# Patient Record
Sex: Male | Born: 1995 | Race: White | Hispanic: No | Marital: Single | Smoking: Never smoker
Health system: Southern US, Community
[De-identification: ages and names within clinical notes are randomized; demographics above are authoritative.]

---
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
## 2018-01-05 ENCOUNTER — Ambulatory Visit (HOSPITAL_COMMUNITY)
Admission: EM | Admit: 2018-01-05 | Discharge: 2018-01-05 | Disposition: A | Discharge status: Home / Self Care | Payer: Medicaid Other | Attending: Internal Medicine | Admitting: Internal Medicine

## 2018-01-05 ENCOUNTER — Encounter (HOSPITAL_COMMUNITY): Payer: Self-pay

## 2018-01-05 DIAGNOSIS — S161XXA Strain of muscle, fascia and tendon at neck level, initial encounter: Secondary | ICD-10-CM | POA: Diagnosis not present

## 2018-01-05 MED ORDER — CYCLOBENZAPRINE HCL 5 MG PO TABS: 5.0000 mg | ORAL_TABLET | Freq: Every day | ORAL | 0 refills | Status: DC

## 2018-01-05 MED ORDER — MELOXICAM 15 MG PO TABS: 15.0000 mg | ORAL_TABLET | Freq: Every day | ORAL | 0 refills | Status: DC

## 2018-01-05 NOTE — Discharge Instructions (Signed)
See exercises provided.  Use of supportive pillow.  Ice application at end of day.  Flexeril at night as muscle relaxer. May cause drowsiness. Please do not take if driving or drinking alcohol.   Meloxicam daily, take with food. Do not take additional ibuprofen or aleve if taking this.  Please establish with a primary care provider for repeat evaluation if symptoms persist.  Return sooner if develop numbness, tingling or weakness to the arms or hands.

## 2018-01-05 NOTE — ED Triage Notes (Signed)
Pt presents with neck pain on left and right side with movement that has been ongoing for the past 6 months

## 2018-01-05 NOTE — ED Provider Notes (Signed)
[facility]    CSN: [id number] Arrival date & time: 01/05/18  1601     History   Chief Complaint Chief Complaint  Patient presents with  . Neck Pain    HPI TEHRAN RABENOLD is a 22 y.o. male.   Lebron presents with parents with complaints of neck pain. Has been ongoing for the past two months at least. Pain with turning neck primarily. Pain at night. Sharp pain. To bilateral sides of neck. No injury. Does worsen at work. 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 No numbness, tingling or weakness to arms or hands. Has been taking ibuprofen 800mg  every 6-8 hours. Does help some. Does not have a PCP. No headache. No back pain. Without contributing medical history.     ROS per HPI.      History reviewed. No pertinent past medical history.  There are no active problems to display for this patient.   History reviewed. No pertinent surgical history.     Home Medications    Prior to Admission medications   Medication Sig Start Date End Date Taking? Authorizing Provider  cyclobenzaprine (FLEXERIL) 5 MG tablet Take 1 tablet (5 mg total) by mouth at bedtime. 01/05/18   Georgetta Haber, NP  meloxicam (MOBIC) 15 MG tablet Take 1 tablet (15 mg total) by mouth daily. 01/05/18   Georgetta Haber, NP    Family History History reviewed. No pertinent family history.  Social History Social History   Tobacco Use  . Smoking status: Not on file  Substance Use Topics  . Alcohol use: Not on file  . Drug use: Not on file     Allergies   Patient has no allergy information on record.   Review of Systems Review of Systems   Physical Exam Triage Vital Signs ED Triage Vitals [01/05/18 1714]  Enc Vitals Group     BP (!) 130/47     Pulse Rate 65     Resp 20     Temp 98.9 F (37.2 C)     Temp Source Temporal     SpO2 99 %     Weight      Height      Head Circumference      Peak Flow      Pain Score 3   Pain Loc      Pain Edu?      Excl. in [place]?    No data found.  Updated Vital Signs BP (!) 130/47 (BP Location: Left Arm)   Pulse 65   Temp 98.9 F (37.2 C) (Temporal)   Resp 20   SpO2 99%   Visual Acuity Right Eye Distance:   Left Eye Distance:   Bilateral Distance:    Right Eye Near:   Left Eye Near:    Bilateral Near:     Physical Exam  Constitutional: He is oriented to person, place, and time. He appears well-developed and well-nourished.  Neck: Normal range of motion and full passive range of motion without pain. No spinous process tenderness and no muscular tenderness present. No neck rigidity. No erythema and normal range of motion present.    No muscular tenderness on palpation, indicates pain to bilateral neck musculature, without spinous process tenderness; full ROM noted; endorses pain with left and right rotation although no limitation noted; strength equal bilaterally; gross sensation intact, strong radial pulses  Cardiovascular: Normal rate and regular rhythm.  Pulmonary/Chest: Effort normal  and breath sounds normal.  Neurological: He is alert and oriented to person, place, and time.  Skin: Skin is warm and dry.     UC Treatments / Results  Labs (all labs ordered are listed, but only abnormal results are displayed) Labs Reviewed - No data to display  EKG None  Radiology No results found.  Procedures Procedures (including critical care time)  Medications Ordered in UC Medications - No data to display  Initial Impression / Assessment and Plan / UC Course  I have reviewed the triage vital signs and the nursing notes.  Pertinent labs & imaging results that were available during my care of the patient were reviewed by me and considered in my medical decision making (see chart for details).     No injury. No bony tenderness. Intermittent pain. Patient constantly with neck flexion at work, question if this is contributing to pain. Daily meloxicam  initiated. Flexeril at night with drowsy precautions provided. Encouraged neck exercises and supportive pillow. Encouraged follow up with PCP for recheck if persistent symptoms. Return precautions provided. Patient verbalized understanding and agreeable to plan.    Final Clinical Impressions(s) / UC Diagnoses   Final diagnoses:  Strain of neck muscle, initial encounter     Discharge Instructions     See exercises provided.  Use of supportive pillow.  Ice application at end of day.  Flexeril at night as muscle relaxer. May cause drowsiness. Please do not take if driving or drinking alcohol.   Meloxicam daily, take with food. Do not take additional ibuprofen or aleve if taking this.  Please establish with a primary care provider for repeat evaluation if symptoms persist.  Return sooner if develop numbness, tingling or weakness to the arms or hands.    ED Prescriptions    Medication Sig Dispense Auth. Provider   meloxicam (MOBIC) 15 MG tablet Take 1 tablet (15 mg total) by mouth daily. 20 tablet Linus MakoBurky, Spence Soberano B, NP   cyclobenzaprine (FLEXERIL) 5 MG tablet Take 1 tablet (5 mg total) by mouth at bedtime. 15 tablet Georgetta HaberBurky, Hazim Treadway B, NP     Controlled Substance Prescriptions [place] Controlled Substance Registry consulted? Not Applicable   Georgetta HaberBurky, Akyra Bouchie B, NP 01/05/18 1801

## 2018-03-22 ENCOUNTER — Ambulatory Visit (HOSPITAL_COMMUNITY)
Admission: EM | Admit: 2018-03-22 | Discharge: 2018-03-22 | Disposition: A | Discharge status: Home / Self Care | Payer: Medicaid Other | Attending: Family Medicine | Admitting: Family Medicine

## 2018-03-22 ENCOUNTER — Encounter (HOSPITAL_COMMUNITY): Payer: Self-pay

## 2018-03-22 DIAGNOSIS — K122 Cellulitis and abscess of mouth: Secondary | ICD-10-CM | POA: Diagnosis not present

## 2018-03-22 MED ORDER — PREDNISONE 10 MG (21) PO TBPK: ORAL_TABLET | Freq: Every day | ORAL | 0 refills | Status: DC

## 2018-03-22 NOTE — ED Provider Notes (Signed)
[facility]   [id number] 03/22/18 Arrival Time: 1643  ASSESSMENT & PLAN:  1. Uvulitis    No sign of bacterial infection.  Meds ordered this encounter  Medications  . predniSONE (STERAPRED UNI-PAK 21 TAB) 10 MG (21) TBPK tablet    Sig: Take by mouth daily. Take as directed.    Dispense:  21 tablet    Refill:  0   Follow-up Information    [facility].   Specialty:  Urgent Care Why:  If symptoms worsen or if you are not seeing improvement over the next 24-48 hours. Contact information: [address] [place] [address] [phone]         Reviewed expectations re: course of current medical issues. Questions answered. Outlined signs and symptoms indicating need for more acute intervention. Patient verbalized understanding. After Visit Summary given.   SUBJECTIVE: History from: patient.  Jared Hayes is a 22 y.o. male who reports abrupt onset of "feeling like I have something stuck in the back of my throat". Was in car asleep. Awoke with this feeling. No pain or swallowing difficulties. Occasional gag reflex. No emesis. Afebrile. No recent illnesses. No new foods or medications. No specific aggravating or alleviating factors reported.  Social History   Tobacco Use  Smoking Status Never Smoker  Smokeless Tobacco Never Used    ROS: As per HPI.   OBJECTIVE:  Vitals:   03/22/18 1722  BP: 128/72  Pulse: 73  Resp: 18  Temp: 97.8 F (36.6 C)  TempSrc: Oral  SpO2: 100%  Weight: 131.5 kg    General appearance: alert; appears fatigued HEENT: no throat or mouth erythema; tongue normal; uvula is edematous and elongated, sitting on his tongue Neck: supple without LAD; trachea midline Lungs: unlabored respirations, symmetrical air entry; cough: absent; no respiratory distress Skin: warm and dry Psychological: alert and cooperative; normal mood and affect  NKDA reported.   Social History    Socioeconomic History  . Marital status: Single    Spouse name: Not on file  . Number of children: Not on file  . Years of education: Not on file  . Highest education level: Not on file  Occupational History  . Not on file  Social Needs  . Financial resource strain: Not on file  . Food insecurity:    Worry: Not on file    Inability: Not on file  . Transportation needs:    Medical: Not on file    Non-medical: Not on file  Tobacco Use  . Smoking status: Never Smoker  . Smokeless tobacco: Never Used  Substance and Sexual Activity  . Alcohol use: Not on file  . Drug use: Not on file  . Sexual activity: Not on file  Lifestyle  . Physical activity:    Days per week: Not on file    Minutes per session: Not on file  . Stress: Not on file  Relationships  . Social connections:    Talks on phone: Not on file    Gets together: Not on file    Attends religious service: Not on file    Active member of club or organization: Not on file    Attends meetings of clubs or organizations: Not on file    Relationship status: Not on file  . Intimate partner violence:    Fear of current or ex partner: Not on file    Emotionally abused: Not on file    Physically abused: Not on  file    Forced sexual activity: Not on file  Other Topics Concern  . Not on file  Social History Narrative  . Not on file            Mardella Layman, MD 03/27/18 [phone]

## 2018-03-22 NOTE — ED Triage Notes (Signed)
Pt states  He has SOB. Pt states he has something in the back of his throat.

## 2019-07-18 ENCOUNTER — Other Ambulatory Visit: Payer: Self-pay

## 2019-07-18 ENCOUNTER — Ambulatory Visit (HOSPITAL_COMMUNITY)
Admission: EM | Admit: 2019-07-18 | Discharge: 2019-07-18 | Disposition: A | Discharge status: Home / Self Care | Payer: Medicaid Other | Attending: Family Medicine | Admitting: Family Medicine

## 2019-07-18 ENCOUNTER — Encounter (HOSPITAL_COMMUNITY): Payer: Self-pay

## 2019-07-18 DIAGNOSIS — M26621 Arthralgia of right temporomandibular joint: Secondary | ICD-10-CM

## 2019-07-18 MED ORDER — NAPROXEN 500 MG PO TABS: 500.0000 mg | ORAL_TABLET | Freq: Two times a day (BID) | ORAL | 0 refills | Status: DC

## 2019-07-18 NOTE — Discharge Instructions (Signed)
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  Wearing a mouthguard at night may help.  Follow up as needed for continued or worsening symptoms

## 2019-07-18 NOTE — ED Triage Notes (Signed)
Pt c/o right jaw pain x3 days, noticed "clicking noise when opening mouth". Denies dental problem or injury to area.

## 2019-07-21 NOTE — ED Provider Notes (Signed)
[facility]    CSN: [id number] Arrival date & time: 07/18/19  1423      History   Chief Complaint Chief Complaint  Patient presents with  . jaw pain    HPI Jared Hayes is a 24 y.o. male.   Patient is a 24 year old male who presents today with right jaw pain x3 days.  Symptoms have been intermittent.  Symptoms are mostly present with opening mouth and chewing.  Denies any injuries to the mouth or jaw.  Denies any fevers or swelling. Does admit to grinding teeth at night.  Has not take any medication for his symptoms.  ROS per HPI      History reviewed. No pertinent past medical history.  There are no problems to display for this patient.   History reviewed. No pertinent surgical history.     Home Medications    Prior to Admission medications   Medication Sig Start Date End Date Taking? Authorizing Provider  naproxen (NAPROSYN) 500 MG tablet Take 1 tablet (500 mg total) by mouth 2 (two) times daily. 07/18/19   Orvan July, NP    Family History Family History  Problem Relation Age of Onset  . Diabetes Mother   . Diabetes Father     Social History Social History   Tobacco Use  . Smoking status: Never Smoker  . Smokeless tobacco: Never Used  Substance Use Topics  . Alcohol use: Yes  . Drug use: Yes    Types: Marijuana     Allergies   Patient has no known allergies.   Review of Systems Review of Systems   Physical Exam Triage Vital Signs ED Triage Vitals  Enc Vitals Group     BP 07/18/19 1510 (!) 150/75     Pulse Rate 07/18/19 1510 72     Resp 07/18/19 1510 20     Temp 07/18/19 1510 98 F (36.7 C)     Temp Source 07/18/19 1510 Oral     SpO2 07/18/19 1510 100 %     Weight --      Height --      Head Circumference --      Peak Flow --      Pain Score 07/18/19 1507 6     Pain Loc --      Pain Edu? --      Excl. in [place]? --    No data found.  Updated Vital Signs BP (!) 150/75 (BP Location: Right Arm)   Pulse 72    Temp 98 F (36.7 C) (Oral)   Resp 20   SpO2 100%   Visual Acuity Right Eye Distance:   Left Eye Distance:   Bilateral Distance:    Right Eye Near:   Left Eye Near:    Bilateral Near:     Physical Exam Vitals and nursing note reviewed.  Constitutional:      Appearance: Normal appearance.  HENT:     Head: Normocephalic and atraumatic.      Nose: Nose normal.  Eyes:     Conjunctiva/sclera: Conjunctivae normal.  Pulmonary:     Effort: Pulmonary effort is normal.  Musculoskeletal:        General: Normal range of motion.     Cervical back: Normal range of motion.  Skin:    General: Skin is warm and dry.  Neurological:     Mental Status: He is alert.  Psychiatric:        Mood and Affect: Mood normal.  UC Treatments / Results  Labs (all labs ordered are listed, but only abnormal results are displayed) Labs Reviewed - No data to display  EKG   Radiology No results found.  Procedures Procedures (including critical care time)  Medications Ordered in UC Medications - No data to display  Initial Impression / Assessment and Plan / UC Course  I have reviewed the triage vital signs and the nursing notes.  Pertinent labs & imaging results that were available during my care of the patient were reviewed by me and considered in my medical decision making (see chart for details).     TMJ-treating with naproxen twice a day for pain inflammation.  Recommended wearing a mouthguard at night. 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  Wearing a mouthguard at night may help.  Follow up as needed for continued or worsening symptoms     ED Prescriptions    Medication Sig Dispense Auth. Provider   naproxen  (NAPROSYN) 500 MG tablet Take 1 tablet (500 mg total) by mouth 2 (two) times daily. 30 tablet Dahlia Byes A, NP     PDMP not reviewed this encounter.   Janace Aris, NP 07/21/19 1134

## 2019-12-20 ENCOUNTER — Other Ambulatory Visit: Payer: Self-pay

## 2019-12-20 ENCOUNTER — Encounter (HOSPITAL_BASED_OUTPATIENT_CLINIC_OR_DEPARTMENT_OTHER): Payer: Self-pay | Admitting: *Deleted

## 2019-12-20 ENCOUNTER — Emergency Department (HOSPITAL_BASED_OUTPATIENT_CLINIC_OR_DEPARTMENT_OTHER)
Admission: EM | Admit: 2019-12-20 | Discharge: 2019-12-20 | Disposition: A | Discharge status: Home / Self Care | Payer: Worker's Compensation | Attending: Emergency Medicine | Admitting: Emergency Medicine

## 2019-12-20 DIAGNOSIS — Y9389 Activity, other specified: Secondary | ICD-10-CM | POA: Insufficient documentation

## 2019-12-20 DIAGNOSIS — Z23 Encounter for immunization: Secondary | ICD-10-CM | POA: Diagnosis not present

## 2019-12-20 DIAGNOSIS — Y990 Civilian activity done for income or pay: Secondary | ICD-10-CM | POA: Diagnosis not present

## 2019-12-20 DIAGNOSIS — Y9269 Other specified industrial and construction area as the place of occurrence of the external cause: Secondary | ICD-10-CM | POA: Insufficient documentation

## 2019-12-20 DIAGNOSIS — S0101XA Laceration without foreign body of scalp, initial encounter: Secondary | ICD-10-CM | POA: Insufficient documentation

## 2019-12-20 DIAGNOSIS — W228XXA Striking against or struck by other objects, initial encounter: Secondary | ICD-10-CM | POA: Insufficient documentation

## 2019-12-20 MED ORDER — ACETAMINOPHEN 325 MG PO TABS
650.0000 mg | ORAL_TABLET | Freq: Once | ORAL | Status: AC
  Administered 2019-12-20: 650 mg via ORAL
  Filled 2019-12-20: qty 2

## 2019-12-20 MED ORDER — TETANUS-DIPHTH-ACELL PERTUSSIS 5-2.5-18.5 LF-MCG/0.5 IM SUSP
0.5000 mL | Freq: Once | INTRAMUSCULAR | Status: AC
  Administered 2019-12-20: 0.5 mL via INTRAMUSCULAR
  Filled 2019-12-20: qty 0.5

## 2019-12-20 MED ORDER — ACETAMINOPHEN ER 650 MG PO TBCR: 650.0000 mg | EXTENDED_RELEASE_TABLET | Freq: Three times a day (TID) | ORAL | 0 refills | Status: DC | PRN

## 2019-12-20 NOTE — ED Triage Notes (Signed)
Pt c/o lac to head  By metal at work ,  X 2 hrs ago

## 2019-12-20 NOTE — ED Notes (Signed)
Stapler at bedside. Provider aware.

## 2019-12-20 NOTE — ED Notes (Signed)
Per supervisor which is with pt , no need for UDS

## 2019-12-20 NOTE — Discharge Instructions (Signed)
As discussed, you will need your staples removed in 7 to 10 days.  He may return to the ER report to urgent care or your PCP.  I am sending home with Tylenol as needed for pain.  Take as needed.  Follow-up with PCP symptoms not improved in the next week. Return to the ER if you develop severe headache, nausea/vomiting, or visual changes.  Return to the ER for new or worsening symptoms

## 2019-12-20 NOTE — ED Provider Notes (Signed)
[facility] Provider Note   CSN: [id number] Arrival date & time: 12/20/19  1820     History Chief Complaint  Patient presents with   Head Laceration    Jared Hayes is a 24 y.o. male with no significant past medical history presents to the ED for evaluation of a laceration on the left side of his head.  Patient states he was at work and was removing a metal object from a forklift which hit him on the left side of his head.  Denies loss of consciousness.  Is not currently any blood thinners.  Denies headache, visual changes, nausea and vomiting.  Patient unsure when his last tetanus shot was.  Admits to mild pain around the laceration.  No treatment prior to arrival.  History obtained from patient and past medical records. No interpreter used during encounter.      History reviewed. No pertinent past medical history.  There are no problems to display for this patient.   History reviewed. No pertinent surgical history.     Family History  Problem Relation Age of Onset   Diabetes Mother    Diabetes Father     Social History   Tobacco Use   Smoking status: Never Smoker   Smokeless tobacco: Never Used  Vaping Use   Vaping Use: Never used  Substance Use Topics   Alcohol use: Yes   Drug use: Yes    Types: Marijuana    Home Medications Prior to Admission medications   Medication Sig Start Date End Date Taking? Authorizing Provider  acetaminophen (TYLENOL 8 HOUR) 650 MG CR tablet Take 1 tablet (650 mg total) by mouth every 8 (eight) hours as needed for pain. 12/20/19   Mannie Stabile, PA-C  naproxen (NAPROSYN) 500 MG tablet Take 1 tablet (500 mg total) by mouth 2 (two) times daily. 07/18/19   Janace Aris, NP    Allergies    Patient has no known allergies.  Review of Systems   Review of Systems  Eyes: Negative for visual disturbance.  Gastrointestinal: Negative for diarrhea and vomiting.  Skin: Positive for color  change and wound.  Neurological: Negative for weakness, numbness and headaches.    Physical Exam Updated Vital Signs BP 124/64 (BP Location: Right Arm)    Pulse 78    Temp 98.4 F (36.9 C) (Oral)    Resp 17    Ht 6\' 1"  (1.854 m)    Wt (!) 148.8 kg    SpO2 99%    BMI 43.27 kg/m   Physical Exam Vitals and nursing note reviewed.  Constitutional:      General: He is not in acute distress.    Appearance: He is not ill-appearing.  HENT:     Head: Normocephalic.     Comments: 3 cm laceration on left side of scalp.  Hemostasis achieved.  No raccoon eyes, battle sign, hemotympanum, septal hematomas, or malocclusion.  No deformity of skull. Eyes:     Pupils: Pupils are equal, round, and reactive to light.  Cardiovascular:     Rate and Rhythm: Normal rate and regular rhythm.     Pulses: Normal pulses.     Heart sounds: Normal heart sounds. No murmur heard.  No friction rub. No gallop.   Pulmonary:     Effort: Pulmonary effort is normal.     Breath sounds: Normal breath sounds.  Abdominal:     General: Abdomen is flat. There is no distension.  Palpations: Abdomen is soft.     Tenderness: There is no abdominal tenderness. There is no guarding or rebound.  Musculoskeletal:     Cervical back: Neck supple.     Comments: Able to move all 4 extremities without difficulty.   Skin:    General: Skin is warm and dry.  Neurological:     General: No focal deficit present.     Mental Status: He is alert.     Comments: Speech is clear, able to follow commands CN III-XII intact Normal strength in upper and lower extremities bilaterally including dorsiflexion and plantar flexion, strong and equal grip strength Sensation grossly intact throughout Moves extremities without ataxia, coordination intact No pronator drift Ambulates without difficulty  Psychiatric:        Mood and Affect: Mood normal.        Behavior: Behavior normal.     ED Results / Procedures / Treatments   Labs (all labs  ordered are listed, but only abnormal results are displayed) Labs Reviewed - No data to display  EKG None  Radiology No results found.  Procedures .Marland KitchenLaceration Repair  Date/Time: 12/20/2019 10:33 PM Performed by: Mannie Stabile, PA-C Authorized by: Mannie Stabile, PA-C   Consent:    Consent obtained:  Verbal   Consent given by:  Patient   Risks discussed:  Infection, need for additional repair, pain, poor cosmetic result and poor wound healing   Alternatives discussed:  No treatment and delayed treatment Universal protocol:    Procedure explained and questions answered to patient or proxy's satisfaction: yes     Relevant documents present and verified: yes     Test results available and properly labeled: yes     Imaging studies available: yes     Required blood products, implants, devices, and special equipment available: yes     Site/side marked: yes     Immediately prior to procedure, a time out was called: yes     Patient identity confirmed:  Verbally with patient Anesthesia (see MAR for exact dosages):    Anesthesia method:  None Laceration details:    Location:  Scalp   Length (cm):  3   Depth (mm):  2 Repair type:    Repair type:  Simple Pre-procedure details:    Preparation:  Patient was prepped and draped in usual sterile fashion Exploration:    Hemostasis achieved with:  Direct pressure   Wound exploration: wound explored through full range of motion and entire depth of wound probed and visualized     Wound extent: no fascia violation noted, no foreign bodies/material noted, no muscle damage noted and no vascular damage noted     Contaminated: no   Treatment:    Area cleansed with:  Saline   Amount of cleaning:  Standard   Irrigation solution:  Sterile saline   Irrigation volume:  200   Irrigation method:  Syringe   Visualized foreign bodies/material removed: no   Skin repair:    Repair method:  Staples   Number of staples:  3 Approximation:     Approximation:  Close Post-procedure details:    Dressing:  Open (no dressing)   Patient tolerance of procedure:  Tolerated well, no immediate complications   (including critical care time)  Medications Ordered in ED Medications  Tdap (BOOSTRIX) injection 0.5 mL (has no administration in time range)  acetaminophen (TYLENOL) tablet 650 mg (has no administration in time range)    ED Course  I have reviewed the triage  vital signs and the nursing notes.  Pertinent labs & imaging results that were available during my care of the patient were reviewed by me and considered in my medical decision making (see chart for details).    MDM Rules/Calculators/A&P                         24 year old male presents to the ED due to a laceration on the left side of his head.  Patient notes he was struck in the head by a metal object at work.  No loss of consciousness.  No nausea, vomiting, headache, or visual changes.  Vitals all within normal limits.  Patient nontoxic-appearing.  Physical exam reassuring.  3 cm laceration on left side of scalp.  Hemostasis achieved.  No signs of basilar skull fracture on exam.  Normal neurological exam.  Shared decision making in regards to CT head and patient deferred at this time which I find to be reasonable given low suspicion of any intracranial abnormalities.  Tetanus shot updated here in the ED.  Tylenol given for pain management. Laceration thoroughly cleaned. Staples placed on scalp.  See procedure note above.  Patient discharged with Tylenol for symptomatic relief.  Instructed patient to have staples removed in 7 to 10 days. Strict ED precautions discussed with patient. Patient states understanding and agrees to plan. Patient discharged home in no acute distress and stable vitals.  Final Clinical Impression(s) / ED Diagnoses Final diagnoses:  Laceration of scalp, initial encounter    Rx / DC Orders ED Discharge Orders         Ordered    acetaminophen (TYLENOL  8 HOUR) 650 MG CR tablet  Every 8 hours PRN     Discontinue  Reprint     12/20/19 2137           Mannie Stabile, PA-C 12/20/19 2235    Melene Plan, DO 12/20/19 2243

## 2019-12-24 ENCOUNTER — Other Ambulatory Visit: Payer: Self-pay

## 2019-12-24 ENCOUNTER — Emergency Department (HOSPITAL_BASED_OUTPATIENT_CLINIC_OR_DEPARTMENT_OTHER): Payer: Self-pay

## 2019-12-24 ENCOUNTER — Emergency Department (HOSPITAL_BASED_OUTPATIENT_CLINIC_OR_DEPARTMENT_OTHER)
Admission: EM | Admit: 2019-12-24 | Discharge: 2019-12-24 | Disposition: A | Discharge status: Home / Self Care | Payer: Self-pay | Attending: Emergency Medicine | Admitting: Emergency Medicine

## 2019-12-24 ENCOUNTER — Encounter (HOSPITAL_BASED_OUTPATIENT_CLINIC_OR_DEPARTMENT_OTHER): Payer: Self-pay

## 2019-12-24 DIAGNOSIS — Y990 Civilian activity done for income or pay: Secondary | ICD-10-CM | POA: Insufficient documentation

## 2019-12-24 DIAGNOSIS — S0990XA Unspecified injury of head, initial encounter: Secondary | ICD-10-CM | POA: Insufficient documentation

## 2019-12-24 DIAGNOSIS — F0781 Postconcussional syndrome: Secondary | ICD-10-CM | POA: Insufficient documentation

## 2019-12-24 DIAGNOSIS — W228XXA Striking against or struck by other objects, initial encounter: Secondary | ICD-10-CM | POA: Insufficient documentation

## 2019-12-24 DIAGNOSIS — R42 Dizziness and giddiness: Secondary | ICD-10-CM | POA: Insufficient documentation

## 2019-12-24 DIAGNOSIS — Y9389 Activity, other specified: Secondary | ICD-10-CM | POA: Insufficient documentation

## 2019-12-24 DIAGNOSIS — Y9269 Other specified industrial and construction area as the place of occurrence of the external cause: Secondary | ICD-10-CM | POA: Insufficient documentation

## 2019-12-24 DIAGNOSIS — R110 Nausea: Secondary | ICD-10-CM | POA: Insufficient documentation

## 2019-12-24 MED ORDER — ONDANSETRON 4 MG PO TBDP: 4.0000 mg | ORAL_TABLET | Freq: Three times a day (TID) | ORAL | 0 refills | Status: DC | PRN

## 2019-12-24 NOTE — Discharge Instructions (Signed)
As we discussed, your CT scan was reassuring.  As we discussed, this could be indicative of postconcussion syndrome.  Engage in brain rest, including limiting the amount of phone, screen, TV time.  Additionally, you should not engage in any physical activity for 2 weeks.  Symptoms can persist sometimes between 2 to 4 weeks.  I provided you with Zofran for nausea.  If your symptoms continue to persist, he can follow-up with referred concussion clinic.

## 2019-12-24 NOTE — ED Triage Notes (Signed)
Pt reports head injury 7/16-c/o nausea, confusion, dizzy-NAD-steady gait

## 2019-12-24 NOTE — ED Notes (Signed)
Patient ambulated to CT

## 2019-12-24 NOTE — ED Provider Notes (Signed)
[facility] EMERGENCY DEPARTMENT Provider Note   CSN: [id number] Arrival date & time: 12/24/19  1315     History Chief Complaint  Patient presents with  . Head Injury    Jared Hayes is a 24 y.o. male who presents for evaluation of lightheadedness, difficulty concentrating, intermittent nausea after a head injury that occurred on 12/20/2019.  Patient reports that on 12/20/2019, he hit his head on a forklift at work causing a scalp laceration.  He was seen in the ED at that time and had a discussion regarding head CT.  After engaging in shared decision making.  Patient opted out of head CT.  Patient did not have any LOC and is not on blood thinners.  He comes in today for concerns of intermittent nausea, lightheadedness, difficulty concentrating, fatigue that has been ongoing since the incident.  He denies any new trauma, injury.  He has not had any specific physical activity but states he returned to work and does do a lot of physical lifting.  He has not had any vomiting, numbness/weakness of his arms or legs, difficulty walking, chest pain, difficulty breathing.  The history is provided by the patient.       History reviewed. No pertinent past medical history.  There are no problems to display for this patient.   History reviewed. No pertinent surgical history.     Family History  Problem Relation Age of Onset  . Diabetes Mother   . Diabetes Father     Social History   Tobacco Use  . Smoking status: Never Smoker  . Smokeless tobacco: Never Used  Vaping Use  . Vaping Use: Never used  Substance Use Topics  . Alcohol use: Yes    Comment: occ  . Drug use: Not Currently    Types: Marijuana    Home Medications Prior to Admission medications   Medication Sig Start Date End Date Taking? Authorizing Provider  acetaminophen (TYLENOL 8 HOUR) 650 MG CR tablet Take 1 tablet (650 mg total) by mouth every 8 (eight) hours as needed for pain. 12/20/19   Mannie Stabile, PA-C  naproxen (NAPROSYN) 500 MG tablet Take 1 tablet (500 mg total) by mouth 2 (two) times daily. 07/18/19   Dahlia Byes A, NP  ondansetron (ZOFRAN ODT) 4 MG disintegrating tablet Take 1 tablet (4 mg total) by mouth every 8 (eight) hours as needed for nausea or vomiting. 12/24/19   Maxwell Caul, PA-C    Allergies    Patient has no known allergies.  Review of Systems   Review of Systems  Eyes: Negative for visual disturbance.  Gastrointestinal: Positive for nausea.  Genitourinary: Negative for dysuria and hematuria.  Musculoskeletal: Negative for back pain and neck pain.  Neurological: Positive for light-headedness. Negative for weakness and numbness.  All other systems reviewed and are negative.   Physical Exam Updated Vital Signs BP 140/70 (BP Location: Left Arm)   Pulse 73   Temp 98.6 F (37 C) (Oral)   Resp 16   Ht 6' (1.829 m)   Wt (!) 146.1 kg   SpO2 96%   BMI 43.67 kg/m   Physical Exam Vitals and nursing note reviewed.  Constitutional:      Appearance: Normal appearance. He is well-developed.  HENT:     Head: Normocephalic.      Comments: Healing laceration noted to left lateral scalp with staples in place.  No surrounding warmth, erythema.  No active drainage.  No underlying skull deformity or  crepitus noted. Eyes:     General: Lids are normal.     Conjunctiva/sclera: Conjunctivae normal.     Pupils: Pupils are equal, round, and reactive to light.     Comments: PERRL. EOMs intact. No nystagmus. No neglect.   Cardiovascular:     Rate and Rhythm: Normal rate and regular rhythm.     Pulses: Normal pulses.     Heart sounds: Normal heart sounds. No murmur heard.  No friction rub. No gallop.   Pulmonary:     Effort: Pulmonary effort is normal.     Breath sounds: Normal breath sounds.  Abdominal:     Palpations: Abdomen is soft. Abdomen is not rigid.     Tenderness: There is no abdominal tenderness. There is no guarding.  Musculoskeletal:         General: Normal range of motion.     Cervical back: Full passive range of motion without pain.  Skin:    General: Skin is warm and dry.     Capillary Refill: Capillary refill takes less than 2 seconds.  Neurological:     Mental Status: He is alert and oriented to person, place, and time.     Comments: Cranial nerves III-XII intact Follows commands, Moves all extremities  5/5 strength to BUE and BLE  Sensation intact throughout all major nerve distributions No pronator drift. No gait abnormalities  No slurred speech. No facial droop.   Psychiatric:        Speech: Speech normal.     ED Results / Procedures / Treatments   Labs (all labs ordered are listed, but only abnormal results are displayed) Labs Reviewed - No data to display  EKG None  Radiology CT Head Wo Contrast  Result Date: 12/24/2019 CLINICAL DATA:  Head injury. EXAM: CT HEAD WITHOUT CONTRAST TECHNIQUE: Contiguous axial images were obtained from the base of the skull through the vertex without intravenous contrast. COMPARISON:  08/08/2009 head CT. FINDINGS: Brain: No acute infarct or intracranial hemorrhage. No mass lesion. No midline shift, ventriculomegaly or extra-axial fluid collection. Vascular: No hyperdense vessel or unexpected calcification. Skull: Negative for fracture or focal lesion. Sinuses/Orbits: Normal orbits. Clear paranasal sinuses. No mastoid effusion. Other: Left frontal scalp staples. IMPRESSION: No acute intracranial process. Sequela of left frontal scalp laceration repair. Electronically Signed   By: Stana Bunting M.D.   On: 12/24/2019 14:09    Procedures Procedures (including critical care time)  Medications Ordered in ED Medications - No data to display  ED Course  I have reviewed the triage vital signs and the nursing notes.  Pertinent labs & imaging results that were available during my care of the patient were reviewed by me and considered in my medical decision making (see chart for  details).    MDM Rules/Calculators/A&P                          24 year old male who presents for evaluation of nausea, difficulty concentrating, lightheadedness that has been intermittently occurring since a head injury on 12/20/2019.  He was seen in the ED for evaluation of laceration repair.  At that time, he again shared decision-making regarding CT scan and opted to decline.  He comes in today because he is having some nausea, lightheadedness, difficulty concentrating.  No new trauma, injury.  He did go back to work which he states requires a lot of physical activity.  On initially arrival, he is afebrile nontoxic-appearing.  Vital signs are stable.  On exam, no evidence of neuro deficits.  No gait ataxia.  Suspect this is most likely postconcussion syndrome.  Given that he is having symptoms, will plan for CT head for evaluation of any acute intracranial normality.  Patient is agreeable.  CT scan negative for any acute intracranial abnormality.   Discussed results with patient.  At this time, patient is hemodynamically stable without any signs of neurological deficits.  I personally ambulated patient with no signs of gait ataxia.  History/physical exam is not concerning for posterior circulation stroke.  Encouraged at home supportive care measures, including brain rest, limiting physical activity.  Will send Zofran for nausea. At this time, patient exhibits no emergent life-threatening condition that require further evaluation in ED or admission. Patient had ample opportunity for questions and discussion. All patient's questions were answered with full understanding. Strict return precautions discussed. Patient expresses understanding and agreement to plan.   Portions of this note were generated with Scientist, clinical (histocompatibility and immunogenetics). Dictation errors may occur despite best attempts at proofreading.  Final Clinical Impression(s) / ED Diagnoses Final diagnoses:  Post concussion syndrome    Rx / DC  Orders ED Discharge Orders         Ordered    ondansetron (ZOFRAN ODT) 4 MG disintegrating tablet  Every 8 hours PRN     Discontinue  Reprint     12/24/19 1508           Maxwell Caul, PA-C 12/24/19 1529    Sabas Sous, MD 12/24/19 [phone]

## 2019-12-26 ENCOUNTER — Telehealth: Payer: Self-pay | Admitting: Physical Therapy

## 2019-12-26 NOTE — Telephone Encounter (Signed)
Called pt regarding referral to concussion clinic and pt declines visit stating that he's been feeling better since being seen at the ED on 12/24/19.

## 2019-12-29 ENCOUNTER — Emergency Department (HOSPITAL_BASED_OUTPATIENT_CLINIC_OR_DEPARTMENT_OTHER)
Admission: EM | Admit: 2019-12-29 | Discharge: 2019-12-29 | Disposition: A | Discharge status: Home / Self Care | Payer: Self-pay | Attending: Emergency Medicine | Admitting: Emergency Medicine

## 2019-12-29 ENCOUNTER — Other Ambulatory Visit: Payer: Self-pay

## 2019-12-29 ENCOUNTER — Encounter (HOSPITAL_BASED_OUTPATIENT_CLINIC_OR_DEPARTMENT_OTHER): Payer: Self-pay

## 2019-12-29 DIAGNOSIS — Z4802 Encounter for removal of sutures: Secondary | ICD-10-CM | POA: Insufficient documentation

## 2019-12-29 NOTE — ED Provider Notes (Signed)
[facility] EMERGENCY DEPARTMENT Provider Note   CSN: [id number] Arrival date & time: 12/29/19  1447     History Chief Complaint  Patient presents with  . Suture / Staple Removal    Jared Hayes is a 24 y.o. male.  HPI   Patient presents to the emergency department with chief complaint of 3 staples that need to be removed from his scalp.  Patient was seen here on the 16th because a forklift hit his head.  He received 3 staples and has come back to have them removed and for a wound evaluation.  Patient has no complaints at this time.  He has no significant medical history, does not take any medication on daily basis.  He denies headache, fever, chills, purulent discharge from the wound, loss of balance, nausea, vomiting, paresthesias in his extremities, shortness of breath, chest pain, abdominal pain, pedal edema.  History reviewed. No pertinent past medical history.  There are no problems to display for this patient.   History reviewed. No pertinent surgical history.     Family History  Problem Relation Age of Onset  . Diabetes Mother   . Diabetes Father     Social History   Tobacco Use  . Smoking status: Never Smoker  . Smokeless tobacco: Never Used  Vaping Use  . Vaping Use: Never used  Substance Use Topics  . Alcohol use: Yes    Comment: occ  . Drug use: Not Currently    Types: Marijuana    Home Medications Prior to Admission medications   Medication Sig Start Date End Date Taking? Authorizing Provider  acetaminophen (TYLENOL 8 HOUR) 650 MG CR tablet Take 1 tablet (650 mg total) by mouth every 8 (eight) hours as needed for pain. 12/20/19   Mannie Stabile, PA-C  naproxen (NAPROSYN) 500 MG tablet Take 1 tablet (500 mg total) by mouth 2 (two) times daily. 07/18/19   Dahlia Byes A, NP  ondansetron (ZOFRAN ODT) 4 MG disintegrating tablet Take 1 tablet (4 mg total) by mouth every 8 (eight) hours as needed for nausea or vomiting. 12/24/19    Maxwell Caul, PA-C    Allergies    Patient has no known allergies.  Review of Systems   Review of Systems  Constitutional: Negative for chills and fever.  HENT: Negative for congestion, tinnitus and trouble swallowing.   Eyes: Negative for visual disturbance.  Respiratory: Negative for cough and shortness of breath.   Cardiovascular: Negative for chest pain.  Gastrointestinal: Negative for abdominal pain, nausea and vomiting.  Genitourinary: Negative for enuresis.  Musculoskeletal: Negative for back pain.  Skin: Negative for rash.  Neurological: Negative for dizziness, weakness and headaches.  Hematological: Does not bruise/bleed easily.    Physical Exam Updated Vital Signs BP (!) 126/92 (BP Location: Left Arm)   Pulse 89   Temp 98.5 F (36.9 C) (Oral)   Resp 16   Ht 6' (1.829 m)   Wt (!) 145.2 kg   SpO2 97%   BMI 43.40 kg/m   Physical Exam Vitals and nursing note reviewed.  Constitutional:      General: He is not in acute distress.    Appearance: Normal appearance. He is not ill-appearing or diaphoretic.  HENT:     Head: Normocephalic and atraumatic.     Comments: Patient scalp was evaluated, there were 3 staples on the left top parietal lobe.  Wound appears to be healing well, there is no induration or fluctuance felt, there is no  erythema, swelling, drainage or pain or discharge noted.    Nose: No congestion or rhinorrhea.  Eyes:     General: No scleral icterus.       Right eye: No discharge.        Left eye: No discharge.     Conjunctiva/sclera: Conjunctivae normal.  Pulmonary:     Effort: Pulmonary effort is normal. No respiratory distress.     Breath sounds: Normal breath sounds. No wheezing.  Musculoskeletal:     Cervical back: Neck supple.     Right lower leg: No edema.     Left lower leg: No edema.  Skin:    General: Skin is warm and dry.     Capillary Refill: Capillary refill takes less than 2 seconds.     Coloration: Skin is not jaundiced or  pale.  Neurological:     Mental Status: He is alert and oriented to person, place, and time.  Psychiatric:        Mood and Affect: Mood normal.     ED Results / Procedures / Treatments   Labs (all labs ordered are listed, but only abnormal results are displayed) Labs Reviewed - No data to display  EKG None  Radiology No results found.  Procedures .Suture Removal  Date/Time: 12/29/2019 5:06 PM Performed by: Carroll Sage, PA-C Authorized by: Carroll Sage, PA-C   Consent:    Consent obtained:  Verbal   Consent given by:  Patient   Risks discussed:  Bleeding, pain and wound separation   Alternatives discussed:  No treatment Location:    Location:  Head/neck   Head/neck location:  Scalp Procedure details:    Wound appearance:  No signs of infection, good wound healing and clean   Number of staples removed:  3 Post-procedure details:    Post-removal:  No dressing applied   Patient tolerance of procedure:  Tolerated well, no immediate complications   (including critical care time)  Medications Ordered in ED Medications - No data to display  ED Course  I have reviewed the triage vital signs and the nursing notes.  Pertinent labs & imaging results that were available during my care of the patient were reviewed by me and considered in my medical decision making (see chart for details).    MDM Rules/Calculators/A&P                          I have personally reviewed all imaging, labs and have interpreted them.  I have low suspicion for cellulitis as wound was nonerythematous, no swelling noted, no drainage or purulent discharge seen on exam, wound was palpated no induration or fluctuance felt.  Unlikely patient suffering from intracranial head bleed as he denies headache, decrease in balance, paresthesias in extremities, vision changes, patient had no note focal deficits during exam.  Patient tolerated procedure well and 3 staples were removed.  Due to  patient is nontoxic-appearing, reassuring vital signs, and benign physical exam further lab work and imaging were not indicated at this time.  Patient appears resting calmly in bed showing no acute signs stress.  Vital signs remained stable does not meet criteria to be admitted to the hospital.  Patient presented to have staples removed, recommend continue wound care and follow-up with primary care doctor if needed.  Discussed with attending and he agrees with assessment and plan.  Patient was given at home care as well as strict return precautions.  Patient verbalized understood agree  with said plan. Final Clinical Impression(s) / ED Diagnoses Final diagnoses:  Encounter for staple removal    Rx / DC Orders ED Discharge Orders    None       Carroll Sage, PA-C 12/29/19 1707    Melene Plan, DO 12/29/19 1913

## 2019-12-29 NOTE — ED Triage Notes (Signed)
Pt arrives ambulatory to ED to have staples removed from left head.

## 2019-12-29 NOTE — Discharge Instructions (Addendum)
You have been seen here to remove 3 staples.  Staples have been removed, wound appears to be healing nicely.  Continue to keep area clean.  You can take over-the-counter pain medications if needed like ibuprofen or Tylenol. please follow dosing on the back of bottle.  You can follow-up with your primary care doctor if needed.  I want you to come back to the emergency department if you develop chest pain, shortness of breath, uncontrolled nausea, vomiting, diarrhea, fever as these  symptoms require further evaluation management.

## 2020-01-23 ENCOUNTER — Other Ambulatory Visit: Payer: Self-pay

## 2020-01-23 DIAGNOSIS — Z20822 Contact with and (suspected) exposure to covid-19: Secondary | ICD-10-CM

## 2020-01-24 LAB — NOVEL CORONAVIRUS, NAA: SARS-CoV-2, NAA: DETECTED — AB

## 2020-01-24 LAB — SARS-COV-2, NAA 2 DAY TAT

## 2021-07-01 ENCOUNTER — Inpatient Hospital Stay (HOSPITAL_COMMUNITY)
Admission: AD | Admit: 2021-07-01 | Discharge: 2021-07-05 | DRG: 885 | Disposition: A | Discharge status: Home / Self Care | Payer: 59 | Source: Intra-hospital | Attending: Psychiatry | Admitting: Psychiatry

## 2021-07-01 ENCOUNTER — Other Ambulatory Visit: Payer: Self-pay

## 2021-07-01 ENCOUNTER — Ambulatory Visit (INDEPENDENT_AMBULATORY_CARE_PROVIDER_SITE_OTHER)
Admission: EM | Admit: 2021-07-01 | Discharge: 2021-07-01 | Disposition: A | Discharge status: Other Acute Inpatient Hospital | Payer: 59 | Source: Home / Self Care

## 2021-07-01 ENCOUNTER — Encounter (HOSPITAL_COMMUNITY): Payer: Self-pay | Admitting: Psychiatry

## 2021-07-01 ENCOUNTER — Encounter (HOSPITAL_COMMUNITY): Payer: Self-pay | Admitting: Family Medicine

## 2021-07-01 DIAGNOSIS — F4323 Adjustment disorder with mixed anxiety and depressed mood: Secondary | ICD-10-CM | POA: Diagnosis present

## 2021-07-01 DIAGNOSIS — F411 Generalized anxiety disorder: Secondary | ICD-10-CM

## 2021-07-01 DIAGNOSIS — Z20822 Contact with and (suspected) exposure to covid-19: Secondary | ICD-10-CM | POA: Insufficient documentation

## 2021-07-01 DIAGNOSIS — G4700 Insomnia, unspecified: Secondary | ICD-10-CM | POA: Diagnosis present

## 2021-07-01 DIAGNOSIS — F322 Major depressive disorder, single episode, severe without psychotic features: Secondary | ICD-10-CM | POA: Diagnosis present

## 2021-07-01 DIAGNOSIS — R45851 Suicidal ideations: Secondary | ICD-10-CM

## 2021-07-01 DIAGNOSIS — F4321 Adjustment disorder with depressed mood: Secondary | ICD-10-CM | POA: Diagnosis present

## 2021-07-01 LAB — COMPREHENSIVE METABOLIC PANEL
ALT: 33 U/L (ref 0–44)
AST: 26 U/L (ref 15–41)
Albumin: 4.3 g/dL (ref 3.5–5.0)
Alkaline Phosphatase: 74 U/L (ref 38–126)
Anion gap: 9 (ref 5–15)
BUN: 10 mg/dL (ref 6–20)
CO2: 27 mmol/L (ref 22–32)
Calcium: 9.5 mg/dL (ref 8.9–10.3)
Chloride: 103 mmol/L (ref 98–111)
Creatinine, Ser: 1.09 mg/dL (ref 0.61–1.24)
GFR, Estimated: >60 mL/min (ref >60)
Glucose, Bld: 94 mg/dL (ref 70–99)
Potassium: 3.5 mmol/L (ref 3.5–5.1)
Sodium: 139 mmol/L (ref 135–145)
Total Bilirubin: 1.2 mg/dL (ref 0.3–1.2)
Total Protein: 7 g/dL (ref 6.5–8.1)

## 2021-07-01 LAB — POCT URINE DRUG SCREEN - MANUAL ENTRY (I-SCREEN)
POC Amphetamine UR: NOT DETECTED
POC Buprenorphine (BUP): NOT DETECTED
POC Cocaine UR: NOT DETECTED
POC Marijuana UR: NOT DETECTED
POC Methadone UR: NOT DETECTED
POC Methamphetamine UR: NOT DETECTED
POC Morphine: NOT DETECTED
POC Oxazepam (BZO): NOT DETECTED
POC Oxycodone UR: NOT DETECTED
POC Secobarbital (BAR): NOT DETECTED

## 2021-07-01 LAB — CBC WITH DIFFERENTIAL/PLATELET
Abs Immature Granulocytes: 0.11 10*3/uL — ABNORMAL HIGH (ref 0.00–0.07)
Basophils Absolute: 0 10*3/uL (ref 0.0–0.1)
Basophils Relative: 0 %
Eosinophils Absolute: 0.1 10*3/uL (ref 0.0–0.5)
Eosinophils Relative: 1 %
HCT: 47.2 % (ref 39.0–52.0)
Hemoglobin: 17.1 g/dL — ABNORMAL HIGH (ref 13.0–17.0)
Immature Granulocytes: 2 %
Lymphocytes Relative: 22 %
Lymphs Abs: 1.6 10*3/uL (ref 0.7–4.0)
MCH: 30.3 pg (ref 26.0–34.0)
MCHC: 36.2 g/dL — ABNORMAL HIGH (ref 30.0–36.0)
MCV: 83.7 fL (ref 80.0–100.0)
Monocytes Absolute: 0.4 10*3/uL (ref 0.1–1.0)
Monocytes Relative: 5 %
Neutro Abs: 4.8 10*3/uL (ref 1.7–7.7)
Neutrophils Relative %: 70 %
Platelets: 175 10*3/uL (ref 150–400)
RBC: 5.64 MIL/uL (ref 4.22–5.81)
RDW: 12.9 % (ref 11.5–15.5)
WBC: 7 10*3/uL (ref 4.0–10.5)
nRBC: 0 % (ref 0.0–0.2)

## 2021-07-01 LAB — LIPID PANEL
Cholesterol: 178 mg/dL (ref 0–200)
HDL: 32 mg/dL — ABNORMAL LOW (ref >40)
LDL Cholesterol: 127 mg/dL — ABNORMAL HIGH (ref 0–99)
Total CHOL/HDL Ratio: 5.6 RATIO
Triglycerides: 96 mg/dL (ref <150)
VLDL: 19 mg/dL (ref 0–40)

## 2021-07-01 LAB — POC SARS CORONAVIRUS 2 AG: SARSCOV2ONAVIRUS 2 AG: NEGATIVE

## 2021-07-01 LAB — POC SARS CORONAVIRUS 2 AG -  ED: SARS Coronavirus 2 Ag: NEGATIVE

## 2021-07-01 LAB — RESP PANEL BY RT-PCR (FLU A&B, COVID) ARPGX2
Influenza A by PCR: NEGATIVE
Influenza B by PCR: NEGATIVE
SARS Coronavirus 2 by RT PCR: NEGATIVE

## 2021-07-01 LAB — ETHANOL: Alcohol, Ethyl (B): <10 mg/dL (ref <10)

## 2021-07-01 LAB — TSH: TSH: 3.717 u[IU]/mL (ref 0.350–4.500)

## 2021-07-01 LAB — HEMOGLOBIN A1C
Hgb A1c MFr Bld: 4.9 % (ref 4.8–5.6)
Mean Plasma Glucose: 94 mg/dL

## 2021-07-01 MED ORDER — ACETAMINOPHEN 325 MG PO TABS
650.0000 mg | ORAL_TABLET | Freq: Four times a day (QID) | ORAL | Status: DC | PRN
  Administered 2021-07-01: 650 mg via ORAL
  Filled 2021-07-01: qty 2

## 2021-07-01 MED ORDER — TRAZODONE HCL 100 MG PO TABS: 100.0000 mg | ORAL_TABLET | Freq: Every evening | ORAL | Status: DC | PRN

## 2021-07-01 MED ORDER — TRAZODONE HCL 50 MG PO TABS: 50.0000 mg | ORAL_TABLET | Freq: Every evening | ORAL | Status: DC | PRN

## 2021-07-01 MED ORDER — HYDROXYZINE HCL 25 MG PO TABS
25.0000 mg | ORAL_TABLET | Freq: Three times a day (TID) | ORAL | Status: DC | PRN
  Administered 2021-07-01 – 2021-07-05 (×7): 25 mg via ORAL
  Filled 2021-07-01 (×7): qty 1

## 2021-07-01 MED ORDER — LORAZEPAM 1 MG PO TABS: 1.0000 mg | ORAL_TABLET | ORAL | Status: DC | PRN

## 2021-07-01 MED ORDER — OLANZAPINE 10 MG PO TBDP
10.0000 mg | ORAL_TABLET | Freq: Three times a day (TID) | ORAL | Status: DC | PRN
  Administered 2021-07-02: 10 mg via ORAL
  Filled 2021-07-01: qty 1

## 2021-07-01 MED ORDER — HYDROXYZINE HCL 25 MG PO TABS: 50.0000 mg | ORAL_TABLET | Freq: Three times a day (TID) | ORAL | Status: DC | PRN

## 2021-07-01 MED ORDER — ALUM & MAG HYDROXIDE-SIMETH 200-200-20 MG/5ML PO SUSP: 30.0000 mL | ORAL | Status: DC | PRN

## 2021-07-01 MED ORDER — TRAZODONE HCL 50 MG PO TABS
50.0000 mg | ORAL_TABLET | Freq: Every evening | ORAL | Status: DC | PRN
  Administered 2021-07-01 – 2021-07-04 (×3): 50 mg via ORAL
  Filled 2021-07-01 (×3): qty 1

## 2021-07-01 MED ORDER — ESCITALOPRAM OXALATE 10 MG PO TABS
10.0000 mg | ORAL_TABLET | Freq: Every day | ORAL | Status: DC
  Administered 2021-07-01: 10 mg via ORAL
  Filled 2021-07-01: qty 1

## 2021-07-01 MED ORDER — ESCITALOPRAM OXALATE 10 MG PO TABS
10.0000 mg | ORAL_TABLET | Freq: Every day | ORAL | Status: DC
  Administered 2021-07-02: 10 mg via ORAL
  Filled 2021-07-01 (×3): qty 1

## 2021-07-01 MED ORDER — ACETAMINOPHEN 325 MG PO TABS: 650.0000 mg | ORAL_TABLET | Freq: Four times a day (QID) | ORAL | Status: DC | PRN

## 2021-07-01 MED ORDER — MAGNESIUM HYDROXIDE 400 MG/5ML PO SUSP: 30.0000 mL | Freq: Every day | ORAL | Status: DC | PRN

## 2021-07-01 MED ORDER — ZIPRASIDONE MESYLATE 20 MG IM SOLR: 20.0000 mg | INTRAMUSCULAR | Status: DC | PRN

## 2021-07-01 MED ORDER — HYDROXYZINE HCL 25 MG PO TABS: 25.0000 mg | ORAL_TABLET | Freq: Three times a day (TID) | ORAL | Status: DC | PRN

## 2021-07-01 MED ORDER — ESCITALOPRAM OXALATE 10 MG PO TABS: 5.0000 mg | ORAL_TABLET | Freq: Every day | ORAL | Status: DC

## 2021-07-01 NOTE — Medical Student Note (Incomplete)
[facility] Provider [facility] Note For educational purposes for Medical, PA and NP students only and not part of the legal medical record.   CSN: [id number] Arrival date & time: 07/01/21  [id number]      History   Chief Complaint No chief complaint on file.   HPI Jared Hayes is a 26 y.o. male.  HPI Patient without a documented mental health history presents today for evaluation of worsening anxiety and depression over the last month. Patient resides at home with his parents and grandparents. He is employed full-time and attend college virtually full-time. Patient reports multiple stressors over the last few week which he attributes to worsening symptoms. He reports the death of two of his dogs over the last month, loss of 3 month relationship 1.5 weeks prior, and recurrent feelings that his family doesn't support him. He has missed work over seven times this month due to feelings of anxiety and depression. He reports feeling depressed and anxious intermittently for several years, although has never previously been diagnosed or treated for any mental health problems. He endorses passive suicidal ideations over an unspecified period of time without a specific plan. Damon reports no access to weapons including guns. He denies homicidal ideations, auditory hallucination, or visual hallucinations. He currently under the care of licensed counselor at [facility], Shannon Sauls, and received a new prescription for Lexapro 5 mg yesterday and taken one dose. Medication was prescribed through [email or web address]. When asked by the provider to contract for safety, patient reported being unable to guarantee that he would not harm himself. When asked to clarify, patient again reported being guarantee safety if discharged.  Patient is being admitted to observation unit for safety and will be re-evaluated tomorrow.  General Appearance: Fairly Groomed  Eye Contact:  Good  Speech:  Normal Rate  Volume:   Normal  Mood:  Depressed  Affect:  Flat and Blunted   Thought Process:  Goal Directed  Orientation:  Full (Time, Place, and Person)  Thought Content:  Logical  Suicidal Thoughts:  Yes.  without intent/plan  Homicidal Thoughts:  No  Memory:  Immediate;   Good Recent;   Good Remote;   Good  Judgement:  Intact  Insight:  Good  Psychomotor Activity:  Normal  Concentration:  Concentration: Good and Attention Span: Good  Recall:  Good  Fund of Knowledge:  Good  Language:  Good  Akathisia:  No  Handed:  Right  AIMS (if indicated):     Assets:  Communication Skills Desire for Improvement Housing Physical Health Transportation Vocational/Educational  ADL's:  Intact  Cognition:  WNL  Sleep:        History reviewed. No pertinent past medical history.  There are no problems to display for this patient.   History reviewed. No pertinent surgical history.     Home Medications    Prior to Admission medications   Medication Sig Start Date End Date Taking? Authorizing Provider  escitalopram (LEXAPRO) 5 MG tablet Take 5 mg by mouth daily.   Yes [provider]  acetaminophen (TYLENOL 8 HOUR) 650 MG CR tablet Take 1 tablet (650 mg total) by mouth every 8 (eight) hours as needed for pain. 12/20/19   Mannie StabileAberman, Caroline C, PA-C  naproxen (NAPROSYN) 500 MG tablet Take 1 tablet (500 mg total) by mouth 2 (two) times daily. 07/18/19   Bast, Gloris Manchesterraci A, NP  ondansetron (ZOFRAN ODT) 4 MG disintegrating tablet Take 1 tablet (4 mg total) by mouth every 8 (eight) hours  as needed for nausea or vomiting. 12/24/19   Graciella Freer A, PA-C  tiZANidine (ZANAFLEX) 2 MG tablet Take 2 mg by mouth every 6 (six) hours as needed. 05/10/21   [provider]    Family History Family History  Problem Relation Age of Onset   Diabetes Mother    Diabetes Father     Social History Social History   Tobacco Use   Smoking status: Never   Smokeless tobacco: Never  Vaping Use   Vaping Use:  Never used  Substance Use Topics   Alcohol use: Yes    Comment: occ   Drug use: Not Currently    Types: Marijuana     Allergies   Patient has no known allergies.   Review of Systems Review of Systems  Constitutional: Negative.   HENT: Negative.    Eyes: Negative.   Respiratory: Negative.    Cardiovascular: Negative.   Gastrointestinal: Negative.   Genitourinary: Negative.   Musculoskeletal: Negative.   Skin: Negative.   Neurological: Negative.   Psychiatric/Behavioral:  Positive for depression and suicidal ideas. Negative for hallucinations, memory loss and substance abuse. The patient is nervous/anxious. The patient does not have insomnia.   Physical Exam Updated Vital Signs BP 122/70 (BP Location: Left Arm)    Pulse 83    Temp 98.8 F (37.1 C) (Oral)    Resp 20    SpO2 97%   Physical Exam Constitutional:      Appearance: Normal appearance.  HENT:     Head: Normocephalic.  Eyes:     Extraocular Movements: Extraocular movements intact.     Pupils: Pupils are equal, round, and reactive to light.  Cardiovascular:     Rate and Rhythm: Normal rate and regular rhythm.  Pulmonary:     Effort: Pulmonary effort is normal.     Breath sounds: Normal breath sounds.  Musculoskeletal:        General: Normal range of motion.  Skin:    General: Skin is warm and dry.  Neurological:     General: No focal deficit present.     Mental Status: He is alert and oriented to person, place, and time.  Psychiatric:        Attention and Perception: Attention normal.        Mood and Affect: Mood is depressed. Affect is blunt.        Speech: Speech normal.        Behavior: Behavior normal. Behavior is cooperative.        Thought Content: Thought content is not paranoid or delusional. Thought content includes suicidal ideation. Thought content does not include homicidal ideation. Thought content does not include homicidal or suicidal plan.        Cognition and Memory: Cognition and memory  normal.        Judgment: Judgment normal.     ED Treatments / Results  Labs (all labs ordered are listed, but only abnormal results are displayed) Labs Reviewed  RESP PANEL BY RT-PCR (FLU A&B, COVID) ARPGX2  CBC WITH DIFFERENTIAL/PLATELET  COMPREHENSIVE METABOLIC PANEL  HEMOGLOBIN A1C  ETHANOL  LIPID PANEL  TSH  POC SARS CORONAVIRUS 2 AG -  ED    EKG  Radiology No results found.  Procedures Procedures (including critical care time)  Medications Ordered in ED Medications  acetaminophen (TYLENOL) tablet 650 mg (has no administration in time range)  alum & mag hydroxide-simeth (MAALOX/MYLANTA) 200-200-20 MG/5ML suspension 30 mL (has no administration in time range)  magnesium hydroxide (  MILK OF MAGNESIA) suspension 30 mL (has no administration in time range)  hydrOXYzine (ATARAX) tablet 25 mg (has no administration in time range)  traZODone (DESYREL) tablet 50 mg (has no administration in time range)  escitalopram (LEXAPRO) tablet 10 mg (has no administration in time range)     Initial Impression / Assessment and Plan / ED Course  I have reviewed the triage vital signs and the nursing notes.  Pertinent labs & imaging results that were available during my care of the patient were reviewed by me and considered in my medical decision making (see chart for details).    Admit to [facility] observation unit overnight for safety and medication management. Patient will be re-evaluated for possible discharge tomorrow, by on-coming treatment team.     Final Clinical Impressions(s) / ED Diagnoses   Final diagnoses:  None    New Prescriptions New Prescriptions   No medications on file

## 2021-07-01 NOTE — Progress Notes (Signed)
Psychoeducational Group Note  Date:  07/01/2021 Time:  2032  Group Topic/Focus:  Wrap-Up Group:   The focus of this group is to help patients review their daily goal of treatment and discuss progress on daily workbooks.  Participation Level: Did Not Attend  Participation Quality:  Not Applicable  Affect:  Not Applicable  Cognitive:  Not Applicable  Insight:  Not Applicable  Engagement in Group: Not Applicable  Additional Comments:  The patient did not attend group this evening.   Hazle Coca S 07/01/2021, 8:32 PM

## 2021-07-01 NOTE — [facility] Group Notes (Signed)
1400 PsychoEducational Group- °Positive Reframing education was discussed during this group. The Dalia Lama poem on ''watch your thoughts they become actions'' was read and patients were given examples of positive reframing. Pt participated and was engaged and appropriate. °

## 2021-07-01 NOTE — ED Provider Notes (Signed)
Behavioral Health Admission [facility])  Date: 07/01/21 Patient Name: Jared Hayes MRN: [id number] Chief Complaint:  Chief Complaint  Patient presents with   Depression   Anxiety   Suicidal      Diagnoses:  Final diagnoses:  Adjustment disorder with depressed mood  Suicidal ideations    HPI: Jared Hayes is a 26 y.o. male who presents to [facility] voluntarily with a friend. Patient without a documented mental health history presents today for evaluation of worsening anxiety and depression over the last month. Patient resides at home with his parents and grandparents. He is employed full-time and attend college virtually full-time. Patient reports multiple stressors over the last few week which he attributes to worsening symptoms. He reports the death of two of his dogs over the last month, loss of 3 month relationship 1.5 weeks prior, and recurrent feelings that his family doesn't support him. He has missed work over seven times this month due to feelings of anxiety and depression. He reports feeling depressed and anxious intermittently for several years, although has never previously been diagnosed or treated for any mental health problems. He endorses passive suicidal ideations over an unspecified period of time without a specific plan. Izea reports no access to weapons including guns. He denies homicidal ideations, auditory hallucination, or visual hallucinations. When asked by the provider to contract for safety, patient reported being unable to guarantee that he would not harm himself. When asked to clarify, patient again reported that he could not contract for safety if discharge.   Patient was evaluated by [email or web address] and was prescribed lexapro 5 mg daily. States he started taking lexapro yesterday. Patient receives therapy through [facility].  On evaluation patient is alert and oriented x 4, pleasant, and cooperative. Speech is clear and coherent. Mood is  depressed/anxious. Affect is blunt. Thought process is coherent and thought content is logical. Denies auditory and visual hallucinations. No indication that patient is responding to internal stimuli. No evidence of delusional thought content. Endorses passive SI without a plan. However, he is unable to contract for safety.  Denies homicidal ideations. Denies substance abuse.    PHQ 2-9:     Total Time spent with patient: 20 minutes  Musculoskeletal  Strength & Muscle Tone: within normal limits Gait & Station: normal Patient leans: N/A  Psychiatric Specialty Exam  Presentation General Appearance: Appropriate for Environment; Casual  Eye Contact:Fair  Speech:Clear and Coherent; Normal Rate  Speech Volume:Normal  Handedness:No data recorded  Mood and Affect  Mood:Depressed; Anxious  Affect:Congruent; Depressed; Blunt   Thought Process  Thought Processes:Coherent; Linear  Descriptions of Associations:Intact  Orientation:Full (Time, Place and Person)  Thought Content:WDL    Hallucinations:Hallucinations: None  Ideas of Reference:None  Suicidal Thoughts:Suicidal Thoughts: Yes, Passive SI Passive Intent and/or Plan: Without Intent; Without Plan  Homicidal Thoughts:Homicidal Thoughts: No   Sensorium  Memory:Immediate Good; Recent Good; Remote Good  Judgment:Fair  Insight:Fair   Executive Functions  Concentration:Fair  Attention Span:Fair  Recall:Good  Fund of Knowledge:Good  Language:Good   Psychomotor Activity  Psychomotor Activity:Psychomotor Activity: Normal   Assets  Assets:Communication Skills; Desire for Improvement; Financial Resources/Insurance; Housing; Physical Health   Sleep  Sleep:Sleep: Fair   Nutritional Assessment (For OBS and [facility] admissions only) Has the patient had a weight loss or gain of 10 pounds or more in the last 3 months?: No Has the patient had a decrease in food intake/or appetite?: No Does the patient have dental  problems?: No Does the patient have eating habits  or behaviors that may be indicators of an eating disorder including binging or inducing vomiting?: No Has the patient recently lost weight without trying?: 0 Has the patient been eating poorly because of a decreased appetite?: 0 Malnutrition Screening Tool Score: 0    Physical Exam Constitutional:      General: He is not in acute distress.    Appearance: He is not ill-appearing, toxic-appearing or diaphoretic.  HENT:     Head: Normocephalic.     Right Ear: External ear normal.     Left Ear: External ear normal.  Eyes:     Pupils: Pupils are equal, round, and reactive to light.  Cardiovascular:     Rate and Rhythm: Normal rate.  Pulmonary:     Effort: Pulmonary effort is normal. No respiratory distress.  Musculoskeletal:        General: Normal range of motion.  Skin:    General: Skin is warm and dry.  Neurological:     Mental Status: He is alert and oriented to person, place, and time.  Psychiatric:        Mood and Affect: Mood is anxious and depressed. Affect is blunt.        Speech: Speech normal.        Behavior: Behavior is cooperative.        Thought Content: Thought content is not paranoid or delusional. Thought content includes suicidal ideation. Thought content does not include homicidal ideation. Thought content does not include suicidal plan.   Review of Systems  Constitutional:  Negative for chills, diaphoresis, fever, malaise/fatigue and weight loss.  HENT:  Negative for congestion.   Respiratory:  Negative for cough and shortness of breath.   Cardiovascular:  Negative for chest pain and palpitations.  Gastrointestinal:  Negative for diarrhea, nausea and vomiting.  Neurological:  Negative for dizziness and seizures.  Psychiatric/Behavioral:  Positive for depression and suicidal ideas. Negative for hallucinations, memory loss and substance abuse. The patient is nervous/anxious and has insomnia.   All other systems  reviewed and are negative.  Blood pressure 122/70, pulse 83, temperature 98.8 F (37.1 C), temperature source Oral, resp. rate 20, SpO2 97 %. There is no height or weight on file to calculate BMI.  Past Psychiatric History: Depression/Anxiety  Is the patient at risk to self? Yes  Has the patient been a risk to self in the past 6 months? No .    Has the patient been a risk to self within the distant past? No   Is the patient a risk to others? No   Has the patient been a risk to others in the past 6 months? No   Has the patient been a risk to others within the distant past? No   Past Medical History: History reviewed. No pertinent past medical history. History reviewed. No pertinent surgical history.  Family History:  Family History  Problem Relation Age of Onset   Diabetes Mother    Diabetes Father     Social History:  Social History   Socioeconomic History   Marital status: Single    Spouse name: Not on file   Number of children: Not on file   Years of education: Not on file   Highest education level: Not on file  Occupational History   Not on file  Tobacco Use   Smoking status: Never   Smokeless tobacco: Never  Vaping Use   Vaping Use: Never used  Substance and Sexual Activity   Alcohol use: Yes  Comment: occ   Drug use: Not Currently    Types: Marijuana   Sexual activity: Not on file  Other Topics Concern   Not on file  Social History Narrative   Not on file   Social Determinants of Health   Financial Resource Strain: Not on file  Food Insecurity: Not on file  Transportation Needs: Not on file  Physical Activity: Not on file  Stress: Not on file  Social Connections: Not on file  Intimate Partner Violence: Not on file    SDOH:  SDOH Screenings   Alcohol Screen: Not on file  Depression (PHQ2-9): Not on file  Financial Resource Strain: Not on file  Food Insecurity: Not on file  Housing: Not on file  Physical Activity: Not on file  Social  Connections: Not on file  Stress: Not on file  Tobacco Use: Low Risk    Smoking Tobacco Use: Never   Smokeless Tobacco Use: Never   Passive Exposure: Not on file  Transportation Needs: Not on file    Last Labs:  Admission on 07/01/2021  Component Date Value Ref Range Status   SARS Coronavirus 2 Ag 07/01/2021 Negative  Negative Preliminary   POC Amphetamine UR 07/01/2021 None Detected  NONE DETECTED (Cut Off Level 1000 ng/mL) Final   POC Secobarbital (BAR) 07/01/2021 None Detected  NONE DETECTED (Cut Off Level 300 ng/mL) Final   POC Buprenorphine (BUP) 07/01/2021 None Detected  NONE DETECTED (Cut Off Level 10 ng/mL) Final   POC Oxazepam (BZO) 07/01/2021 None Detected  NONE DETECTED (Cut Off Level 300 ng/mL) Final   POC Cocaine UR 07/01/2021 None Detected  NONE DETECTED (Cut Off Level 300 ng/mL) Final   POC Methamphetamine UR 07/01/2021 None Detected  NONE DETECTED (Cut Off Level 1000 ng/mL) Final   POC Morphine 07/01/2021 None Detected  NONE DETECTED (Cut Off Level 300 ng/mL) Final   POC Oxycodone UR 07/01/2021 None Detected  NONE DETECTED (Cut Off Level 100 ng/mL) Final   POC Methadone UR 07/01/2021 None Detected  NONE DETECTED (Cut Off Level 300 ng/mL) Final   POC Marijuana UR 07/01/2021 None Detected  NONE DETECTED (Cut Off Level 50 ng/mL) Final    Allergies: Patient has no known allergies.  PTA Medications:  Prior to Admission medications   Medication Sig Start Date End Date Taking? Authorizing Provider  escitalopram (LEXAPRO) 5 MG tablet Take 5 mg by mouth daily.   Yes [provider]  acetaminophen (TYLENOL 8 HOUR) 650 MG CR tablet Take 1 tablet (650 mg total) by mouth every 8 (eight) hours as needed for pain. 12/20/19   Mannie Stabile, PA-C  naproxen (NAPROSYN) 500 MG tablet Take 1 tablet (500 mg total) by mouth 2 (two) times daily. 07/18/19   Dahlia Byes A, NP  ondansetron (ZOFRAN ODT) 4 MG disintegrating tablet Take 1 tablet (4 mg total) by mouth every 8 (eight)  hours as needed for nausea or vomiting. 12/24/19   Maxwell Caul, PA-C  tiZANidine (ZANAFLEX) 2 MG tablet Take 2 mg by mouth every 6 (six) hours as needed. 05/10/21   [provider]      Medical Decision Making  Admit to continuous assessment for crisis stabilization  Increase lexapro to 10 mg daily for depression/anxiety Start hydroxyzine 25 mg TID prn anxiety Start trazodone 50 mg QHS prn  Lab Orders         Resp Panel by RT-PCR (Flu A&B, Covid) Nasopharyngeal Swab         CBC with Differential/Platelet  Comprehensive metabolic panel         Hemoglobin A1c         Ethanol         Lipid panel         TSH         POC SARS Coronavirus 2 Ag-ED - Nasal Swab         POCT Urine Drug Screen - (ICup)         POC SARS Coronavirus 2 Ag         Recommendations  Based on my evaluation the patient does not appear to have an emergency medical condition.  Jackelyn PolingJason A Latoyna Hird, NP 07/01/21  5:30 AM

## 2021-07-01 NOTE — BH Assessment (Signed)
Comprehensive Clinical Assessment (CCA) Note  07/01/2021 Jared Hayes [id number]  Discharge Disposition: Lindon Romp, NP, reviewed pt's chart and information and met with pt face-to-face and determined pt should receive continuous assessment and re-assessed by psychiatry in the morning. Pt was accepted to the [facility].  The patient demonstrates the following risk factors for suicide: Chronic risk factors for suicide include: psychiatric disorder of MDD, Single Episode, Severe  . Acute risk factors for suicide include: family or marital conflict, social withdrawal/isolation, and loss (financial, interpersonal, professional). Protective factors for this patient include: positive therapeutic relationship and hope for the future. Considering these factors, the overall suicide risk at this point appears to be high. Patient is not appropriate for outpatient follow up.  Therefore, a 1:1 sitter is recommended for suicide precautions.  [facility] from 07/01/2021 in [place] High Risk     Chief Complaint:  Chief Complaint  Patient presents with   Depression   Anxiety   Suicidal   Visit Diagnosis: MDD, Single Episode, Severe   CCA Screening, Triage and Referral (STR) Jared Hayes is a 26 year old patient who was brought to the [facility] by a friend due to depression, anxiety, and SI. Pt states, "I've been having a really tough time the last couple weeks. 2 days ago I had to put down my dog - I'd had it since I was a kid. I had a recent break-up a week to a week-and-a-half ago. One of my other dogs died unexpectedly within the last month."   Pt acknowledges he's been experiencing SI and that he's experienced SI in the past. Pt denies he's ever attempted to kill himself or that he has a plan to kill himself. Pt denies HI, AVH, NSSIB, access to guns/weapons, engagement with the legal system, or SA.  Pt is oriented x5. His recent/remote  memory is intact. Pt presents with a flat affect and minimal eye contact. Pt's insight, judgement, and impulse control is impaired at this time.  Patient Reported Information How did you hear about [place]? Self  What Is the Reason for Your Visit/Call Today? Pt states, "I've been having a really tough time the last couple weeks. 2 days ago I had to put down my dog - I'd had it since I was a kid. I had a recent break-up a week to a week-and-a-half ago. One of my other dogs died unexpectedly within the last month." Pt acknowledges he's been experiencing SI and that he's experienced SI in the past. Pt denies he's ever attempted to kill himself or that he has a plan to kill himself. Pt denies HI, AVH, NSSIB, access to guns/weapons, engagement with the legal system, or SA.  How Long Has This Been Causing You Problems? 1 wk - 1 month  What Do You Feel Would Help You the Most Today? Treatment for Depression or other mood problem; Medication(s)   Have You Recently Had Any Thoughts About Hurting Yourself? Yes  Are You Planning to Commit Suicide/Harm Yourself At This time? No   Have you Recently Had Thoughts About [place]? No  Are You Planning to Harm Someone at This Time? No  Explanation: No data recorded  Have You Used Any Alcohol or Drugs in the Past 24 Hours? No  How Long Ago Did You Use Drugs or Alcohol? No data recorded What Did You Use and How Much? No data recorded  Do You Currently Have a Therapist/Psychiatrist? Yes  Name of Therapist/Psychiatrist:  Coolidge Breeze, therapist of [facility]; has been seeing 4-6 weeks. [email or web address], medication manager; started Lexapro 58m 06/30/2021.   Have You Been Recently Discharged From Any Office Practice or Programs? No  Explanation of Discharge From Practice/Program: No data recorded    CCA Screening Triage Referral Assessment Type of Contact: Face-to-Face  Telemedicine Service Delivery:   Is this Initial or Reassessment? No data  recorded Date Telepsych consult ordered in [facility]:  No data recorded Time Telepsych consult ordered in [facility]:  No data recorded [facility]  [facility]   Collateral Involvement: Pt declines collateral at this time   Does Patient Have a [place] No data recorded Name and Contact of Legal Guardian: No data recorded If Minor and Not Living with Parent(s), Who has Custody? N/A  Is CPS involved or ever been involved? Never  Is APS involved or ever been involved? Never   Patient Determined To Be At Risk for Harm To Self or Others Based on Review of Patient Reported Information or Presenting Complaint? Yes, for Self-Harm  Method: No data recorded Availability of Means: No data recorded Intent: No data recorded Notification Required: No data recorded Additional Information for Danger to Others Potential: No data recorded Additional Comments for Danger to Others Potential: No data recorded Are There Guns or Other Weapons in Your Home? No data recorded Types of Guns/Weapons: No data recorded Are These Weapons Safely Secured?                            No data recorded Who Could Verify You Are Able To Have These Secured: No data recorded Do You Have any Outstanding Charges, Pending Court Dates, Parole/Probation? No data recorded Contacted To Inform of Risk of Harm To Self or Others: Family/Significant Other: (Pt states he's expressed his concerns to his family)    Does Patient Present under Involuntary Commitment? No  IVC Papers Initial File Date: No data recorded  [place]  Patient Currently Receiving the Following Services: Medication Management; Individual Therapy   Determination of Need: Urgent (48 hours)   Options For Referral: Outpatient Therapy; Medication Management; [facility]     CCA Biopsychosocial Patient Reported  Schizophrenia/Schizoaffective Diagnosis in Past: No   Strengths: Pt is working full-time while attending school full-time. He is able to identify his thoughts, feelings, and concerns. Pt wants help for his mental health concerns.   Mental Health Symptoms Depression:   Change in energy/activity; Difficulty Concentrating; Hopelessness; Tearfulness   Duration of Depressive symptoms:  Duration of Depressive Symptoms: Greater than two weeks   Mania:   None   Anxiety:    Worrying; Tension; Difficulty concentrating   Psychosis:   None   Duration of Psychotic symptoms:    Trauma:   Avoids reminders of event; Detachment from others   Obsessions:   None   Compulsions:   None   Inattention:   Disorganized; Avoids/dislikes activities that require focus   Hyperactivity/Impulsivity:   None   Oppositional/Defiant Behaviors:   None   Emotional Irregularity:   Potentially harmful impulsivity; Mood lability   Other Mood/Personality Symptoms:   None noted    Mental Status Exam Appearance and self-care  Stature:   Tall   Weight:   Average weight   Clothing:   Casual   Grooming:   Normal   Cosmetic use:   None   Posture/gait:  Normal   Motor activity:   Not Remarkable   Sensorium  Attention:   Normal   Concentration:   Normal   Orientation:   X5   Recall/memory:   Normal   Affect and Mood  Affect:   Depressed   Mood:   Depressed   Relating  Eye contact:   Normal   Facial expression:   Anxious   Attitude toward examiner:   Cooperative; Guarded   Thought and Language  Speech flow:  Clear and Coherent   Thought content:   Appropriate to Mood and Circumstances   Preoccupation:   None   Hallucinations:   None   Organization:  No data recorded  [employer] of Knowledge:   Average   Intelligence:   Average   Abstraction:   Normal   Judgement:   Normal   Reality Testing:   Realistic   Insight:  No  data recorded  Decision Making:   Only simple   Social Functioning  Social Maturity:   Responsible   Social Judgement:   Normal   Stress  Stressors:   Family conflict; Financial; School; Work   Coping Ability:   Advice worker Deficits:   Training and development officer   Supports:   Friends/Service system     Religion: Religion/Spirituality Are You A Religious Person?:  (No) How Might This Affect Treatment?: Not assessed  Leisure/Recreation: Leisure / [place]?: No  Exercise/Diet: Exercise/Diet Do You Exercise?: No Do You Follow a Special Diet?: No Do You Have Any Trouble Sleeping?: No   CCA Employment/Education Employment/Work Situation: Employment / Work Situation Employment Situation: Employed Work Stressors: Pt states he attends school full-time while working full-time Patient's Job has Been Impacted by Current Illness: Yes Describe how Patient's Job has Been Impacted: Pt has missed 3 days of work this week due to anxiety Has Patient ever Been in the [employer]?: No  Education: Education Is Patient Currently Attending School?: Yes School Currently Attending: Full [employer] Last Grade Completed:  (Pt is currently working to obtain his BA) Did Physicist, medical?: Yes What Type of College Degree Do you Have?: Pt is currently working to earn his BA Did You Have An Individualized Education Program (IIEP): No Did You Have Any Difficulty At [employer]?: No Patient's Education Has Been Impacted by Current Illness: No   CCA Family/Childhood History Family and Relationship History: Family history Marital status: Single Does patient have children?: No  Childhood History:  Childhood History By whom was/is the patient raised?: Both parents, Grandparents Did patient suffer any verbal/emotional/physical/sexual abuse as a child?:  (Pt states he is unsure, as he was adopted by his parents at age 60-4 and doesnot remember anyting  prior to being adopted.) Did patient suffer from severe childhood neglect?:  (Pt states he is unsure, as he was adopted by his parents at age 60-4 and doesnot remember anyting prior to being adopted.) Has patient ever been sexually abused/assaulted/raped as an adolescent or adult?: No Was the patient ever a victim of a crime or a disaster?: No Witnessed domestic violence?: No Has patient been affected by domestic violence as an adult?: No  Child/Adolescent Assessment:     CCA Substance Use Alcohol/Drug Use: Alcohol / Drug Use Pain Medications: See MAR Prescriptions: See MAR Over the Counter: See MAR History of alcohol / drug use?: No history of alcohol / drug abuse Longest period of sobriety (when/how long): N/A Negative Consequences of Use:  (N/A) Withdrawal Symptoms:  (  N/A)                         ASAM's:  Six Dimensions of Multidimensional Assessment  Dimension 1:  Acute Intoxication and/or Withdrawal Potential:      Dimension 2:  Biomedical Conditions and Complications:      Dimension 3:  Emotional, Behavioral, or Cognitive Conditions and Complications:     Dimension 4:  Readiness to Change:     Dimension 5:  Relapse, Continued use, or Continued Problem Potential:     Dimension 6:  Recovery/Living Environment:     ASAM Severity Score:    ASAM Recommended Level of Treatment: ASAM Recommended Level of Treatment:  (N/A)   Substance use Disorder (SUD) Substance Use Disorder (SUD)  Checklist Symptoms of Substance Use:  (N/A)  Recommendations for Services/Supports/Treatments: Recommendations for Services/Supports/Treatments Recommendations For Services/Supports/Treatments: Individual Therapy, Medication Management, Other (Comment) (Continuous Assessment at the [facility])  Discharge Disposition: Discharge Disposition Medical Exam completed: Yes Disposition of Patient: Admit Mode of transportation if patient is discharged/movement?: N/A  Lindon Romp, NP, reviewed  pt's chart and information and met with pt face-to-face and determined pt should receive continuous assessment and re-assessed by psychiatry in the morning. Pt was accepted to the [facility].  DSM5 Diagnoses: There are no problems to display for this patient.    Referrals to Alternative Service(s): Referred to Alternative Service(s):   Place:   Date:   Time:    Referred to Alternative Service(s):   Place:   Date:   Time:    Referred to Alternative Service(s):   Place:   Date:   Time:    Referred to Alternative Service(s):   Place:   Date:   Time:     Dannielle Burn, LMFT

## 2021-07-01 NOTE — Tx Team (Signed)
Initial Treatment Plan 07/01/2021 12:51 PM [name] TOI:[id number]    PATIENT STRESSORS: Educational concerns   Financial difficulties   Marital or family conflict   Occupational concerns     PATIENT STRENGTHS: Ability for insight  Average or above average intelligence  Communication skills  Work skills    PATIENT IDENTIFIED PROBLEMS: Family conflict  Breakup with girlfriend  Recent death of 2 dogs  Anxiety  SI without specific plan  "My family just says I'll be ok"  "I don't want to feel so down"         DISCHARGE CRITERIA:  Improved stabilization in mood, thinking, and/or behavior Motivation to continue treatment in a less acute level of care  PRELIMINARY DISCHARGE PLAN: Outpatient therapy Return to previous living arrangement Return to previous work or school arrangements  PATIENT/FAMILY INVOLVEMENT: This treatment plan has been presented to and reviewed with the patient, Jared Hayes,.  The patient and family have been given the opportunity to ask questions and make suggestions.  Cranford Mon, RN 07/01/2021, 12:51 PM

## 2021-07-01 NOTE — [facility] Suicide Risk Assessment (Signed)
[facility] Admission Suicide Risk Assessment   Nursing information obtained from:  Patient Demographic factors:  Male, Adolescent or young adult, Caucasian Current Mental Status:  Suicidal ideation indicated by patient Loss Factors:  Loss of significant relationship Historical Factors:  Impulsivity Risk Reduction Factors:  Sense of responsibility to family  Total Time spent with patient: 45 minutes Principal Problem: MDD (major depressive disorder), single episode, severe , no psychosis ([place]) Diagnosis:  Principal Problem:   MDD (major depressive disorder), single episode, severe , no psychosis ([place]) Active Problems:   Adjustment disorder with depressed mood   Generalized anxiety disorder  Subjective Data: Jared Hayes is a 26 YO M with a history of depression and anxiety who presented with a chief complain of suicidal ideation. He has been feeling this way for a month in the setting of the acute stressors of losing his dog and his relationship. He has not been sleeping well, has been feeling down, poor focus, anhedonia, worthlessness and anxiety. Last night at work he had a panic attack with racing heart and the feeling of his throat closing up. No hallucinations, paranoia or delusions.  Continued Clinical Symptoms:  Alcohol Use Disorder Identification Test Final Score (AUDIT): 0 The "Alcohol Use Disorders Identification Test", Guidelines for Use in Primary Care, Second Edition.  World Pharmacologist [facility]). Score between 0-7:  no or low risk or alcohol related problems. Score between 8-15:  moderate risk of alcohol related problems. Score between 16-19:  high risk of alcohol related problems. Score 20 or above:  warrants further diagnostic evaluation for alcohol dependence and treatment.   CLINICAL FACTORS:   Severe Anxiety and/or Agitation Panic Attacks Depression:   Anhedonia Hopelessness Insomnia Severe   Musculoskeletal: Strength & Muscle Tone: within normal limits Gait &  Station: normal Patient leans: N/A  Psychiatric Specialty Exam:  Presentation  General Appearance: Appropriate for Environment  Eye Contact:Fair  Speech:Clear and Coherent  Speech Volume:Normal  Handedness:Right   Mood and Affect  Mood:Dysphoric; Anxious  Affect:Congruent   Thought Process  Thought Processes:Coherent  Descriptions of Associations:Intact  Orientation:Full (Time, Place and Person)  Thought Content:Logical  History of Schizophrenia/Schizoaffective disorder:No  Duration of Psychotic Symptoms:No data recorded Hallucinations:Hallucinations: None  Ideas of Reference:None  Suicidal Thoughts:Suicidal Thoughts: No SI Active Intent and/or Plan: With Intent; With Plan; With Means to Carry Out SI Passive Intent and/or Plan: Without Intent; Without Plan  Homicidal Thoughts:Homicidal Thoughts: No   Sensorium  Memory:Immediate Fair; Recent Fair  Judgment:Impaired  Insight:Shallow   Executive Functions  Concentration:Fair  Attention Span:Fair  [place]   Psychomotor Activity  Psychomotor Activity:Psychomotor Activity: Decreased   Assets  Assets:Desire for Improvement; Resilience; Housing   Sleep  Sleep:Sleep: Poor Number of Hours of Sleep: 4    Physical Exam: Physical Exam Vitals and nursing note reviewed.  Constitutional:      Appearance: Normal appearance.  HENT:     Head: Normocephalic and atraumatic.  Eyes:     Extraocular Movements: Extraocular movements intact.  Pulmonary:     Effort: Pulmonary effort is normal.  Musculoskeletal:        General: Normal range of motion.     Cervical back: Normal range of motion.  Neurological:     General: No focal deficit present.     Mental Status: He is alert and oriented to person, place, and time.  Psychiatric:        Attention and Perception: Attention normal. He does not perceive auditory or visual hallucinations.  Mood and  Affect: Mood is anxious and depressed. Affect is blunt.        Speech: Speech normal.        Behavior: Behavior normal. Behavior is cooperative.        Thought Content: Thought content is not paranoid or delusional. Thought content includes suicidal ideation. Thought content does not include homicidal ideation. Thought content does not include suicidal plan.        Cognition and Memory: Cognition and memory normal.        Judgment: Judgment is impulsive.   Review of Systems  Constitutional:  Negative for fever.  Cardiovascular:  Negative for chest pain.  Gastrointestinal:  Negative for nausea and vomiting.  Genitourinary:  Negative for dysuria.  Skin:  Negative for rash.  Neurological:  Positive for headaches. Negative for dizziness.  Psychiatric/Behavioral:  Positive for depression and suicidal ideas. Negative for hallucinations. The patient is nervous/anxious and has insomnia.   Blood pressure 123/75, pulse 78, temperature 97.7 F (36.5 C), temperature source Oral, resp. rate 18, height 6' (1.829 m), weight 117.9 kg, SpO2 97 %. Body mass index is 35.26 kg/m.   COGNITIVE FEATURES THAT CONTRIBUTE TO RISK:  None    SUICIDE RISK:   Moderate:  Frequent suicidal ideation with limited intensity, and duration, some specificity in terms of plans, no associated intent, good self-control, limited dysphoria/symptomatology, some risk factors present, and identifiable protective factors, including available and accessible social support.  PLAN OF CARE: Safety and Monitoring --  Admission to inpatient psychiatric unit for safety, stabilization and treatment -- Daily contact with patient to assess and evaluate symptoms and progress in treatment -- Patient's case to be discussed in multi-disciplinary team meeting. -- Patient will be encouraged to participate in the therapeutic group milieu. -- Observation Level : q15 minute checks -- Vital signs:  q12 hours -- Precautions: suicide.  Plan   -Monitor Vitals. -Monitor for thoughts of harm to self or others -Monitor for psychosis, disorganization or changes to cognition -Monitor for withdrawal symptoms. -Monitor for medication side effects.  Labs/Studies: Lipids, A1c  Medications: Lexapro for mood, PRNs for anxiety and sleep   I certify that inpatient services furnished can reasonably be expected to improve the patient's condition.   Maida Sale, MD 07/01/2021, 4:23 PM

## 2021-07-01 NOTE — [facility] Counselor (Signed)
Pt asked for a letter to be sent to his place of employment showing that he has been admitted to the hospital.  The Pt signed a Consent to Release form so the CSW could fax a letter to his place of employment showing that the Pt is currently in the hospital.  CSW faxed this letter on 07/01/2021 at 2:55pm.  ([employer], AttnStasia Cavalier, [phone]).

## 2021-07-01 NOTE — ED Notes (Signed)
Pt A&O x 4, no distress noted, presents with depression, anxiety and passive suicidal ideations without a plan.  Pt unable to contract for safety.  Denies HI or AVH.  Calm & cooperative at present.  Monitoring for safety.

## 2021-07-01 NOTE — Progress Notes (Signed)
[facility] LCSW Progress Note   07/01/2021    10:06 AM  Jared Hayes   [id number]   Type of Contact and Topic:  Psychiatric Bed Placement   Pt accepted to [facility] 401-2    Patient meets inpatient criteria per Nira Conn, NP    The attending provider will be Haze Rushing, MD   Call report to [phone]    Jenean Lindau, RN @ [facility] notified.     Pt scheduled  to arrive at [facility] TODAY at 12 Noon.   Damita Dunnings, MSW, LCSW-A  10:08 AM 07/01/2021

## 2021-07-01 NOTE — H&P (Signed)
Psychiatric Admission Assessment Adult  Patient Identification: Jared Hayes MRN:  [id number] Date of Evaluation:  07/01/2021 Chief Complaint:  Adjustment disorder with depressed mood [F43.21] Principal Diagnosis: Adjustment disorder with mixed anxiety and depressed mood Diagnosis:  Principal Problem:   Adjustment disorder with mixed anxiety and depressed mood Active Problems:   Adjustment disorder with depressed mood  History of Present Illness: Jared Hayes is a 26 yo patient w/ no PPH who presented to [facility] endorsing SI. Patient continued to endorse depressed mood and was transferred to [facility].   Patient is Aox4 today. Patient reports that he has been "going through a lot lately." Patient reports he has multiple stressors, including having to put down one of his dogs 2 days ago. Patient reports that he also lost his other dog earlier this month, recently broke up with his GF of 3 mon, and is overall feeling lonely and unsupported. Patient reports that he has been having SI for the past month. Patient reports that the night he presented to [facility] he attempted to go to his job where he began having a panic attack. Patient reports that he told his now "ex" that he was having thoughts of SI and had a could not stay at work. Patient reports she came to patient and took him to [facility]. Patient endorses that this was his first panic attack and felt like his throat was closing, endorses difficulty breathing and tachycardia.   Patient reports that he has not been resting well for the past few weeks. Patient reports that he has also been feeling more anhedonic recently and struggles with feelings of worthlessness. Patient reports he has had low energy, poor concentration, and decreased appetite. Patient reports he has lost approx 60lbs in the past 5 mon unintentionally. Patient reports he has been struggling with SI over the past month but denies SI currently. Patient denies hx of attempting suicide.  Patient reports that he does have a gun in the home, but it has been moved to his parents room. Patient denies HI and AVH. Patient also denies paranoia, ideas of reference and delusions.   Patient denies any hx of symptoms concerning for hypomania or mania.   Patient endorses that he has been feeling anxious approx 40% of the time and reports that he is just feeling a bit "stuck where I am in life." Patient reports that he frequently finds himself doubting what others tell him and struggles with trusting others. Patient reports he has a fear of being abandoned and endorses that he feels abandoned by his biological mom, his sister, and younger brother. Patient reports that he also feels abandoned by his last 2 ex- girlfriends.   Patient denies and hx of physical or sexual abuse. Patient does not endorse any criteria concerning for PTSD.   Collateral-Spoke w/ mom:  Mom reports that family has asked he pay $200 a month to contribute to family bills, but he is not able to contribute this lately. Mom endorses that he has not been going to work much the last 2 weeks and endorses that he "can't do it."Mom reports in 2021 he went through 12 jobs.  Mom reports that growing up patient had behavior "issues" and endorses some anger problems on impulse. Mom reports that he met his most recent ex around Thanksgiving. Mom reports that this was his first sexual relationship, but she did not feel "ready." Mom reports that the Ex still lives with her ex. Mom reports that patient went "ballistic" when she told  him she was "not ready" for a relationship. Mom reports that she feel patient dropped everything for the girl previous to this. Mom reports that 3 last nights ago she found him crying in the kitchen. Mom does confirm the loss of 2 dogs may also be contributing to patient's depressed mood.  Gun has been removed without easy access to patient to parents room, Per mom.  Associated Signs/Symptoms: Depression Symptoms:   depressed mood, anhedonia, insomnia, feelings of worthlessness/guilt, difficulty concentrating, suicidal thoughts without plan, anxiety, panic attacks, disturbed sleep, weight loss, decreased appetite, Duration of Depression Symptoms: Greater than two weeks  (Hypo) Manic Symptoms:   Denies Anxiety Symptoms:  Excessive Worry, Panic Symptoms, Psychotic Symptoms:   denies PTSD Symptoms: NA Total Time spent with patient: 45 minutes  Past Psychiatric History: None, did buy Lexapro from [employer] 1 day prior to [facility] presentation  Is the patient at risk to self? No.  Has the patient been a risk to self in the past 6 months? No.  Has the patient been a risk to self within the distant past? No.  Is the patient a risk to others? No.  Has the patient been a risk to others in the past 6 months? No.  Has the patient been a risk to others within the distant past? No.   Prior Inpatient Therapy:   Prior Outpatient Therapy:    Alcohol Screening: 1. How often do you have a drink containing alcohol?: Never 2. How many drinks containing alcohol do you have on a typical day when you are drinking?: 1 or 2 3. How often do you have six or more drinks on one occasion?: Never AUDIT-C Score: 0 9. Have you or someone else been injured as a result of your drinking?: No 10. Has a relative or friend or a doctor or another health worker been concerned about your drinking or suggested you cut down?: No Alcohol Use Disorder Identification Test Final Score (AUDIT): 0 Substance Abuse History in the last 12 months:  Yes.    Quite THC use 4-5 mon ago, started at age 37yo. Consequences of Substance Abuse: NA Previous Psychotropic Medications: No  Psychological Evaluations: No  Past Medical History: History reviewed. No pertinent past medical history. History reviewed. No pertinent surgical history. Family History:  Family History  Problem Relation Age of Onset   Diabetes Mother    Diabetes Father     Family Psychiatric  History: Biological dad: Believed to be shot by mom, officially not sure if it was self-inflicted or mom. Patient had bed-wetting for many years after adoption. Mat grandma: Bipolar    Tobacco Screening:   none Social History:  Social History   Substance and Sexual Activity  Alcohol Use Yes   Comment: occ     Social History   Substance and Sexual Activity  Drug Use Not Currently   Types: Marijuana    Additional Social History:       - Adopted at 54 yo due to bio-mom's substance use - Per mom, growing up patient had no dx but was noted to have some anger issues - Currently lives with parents and grandparents - Sister is back with bio mom prostituting and using drugs. 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  The SARS-CoV-2 RNA is generally detectable in upper respiratory specimens during the acute phase of infection. The lowest concentration of SARS-CoV-2 viral copies this assay can detect is 138 copies/mL. A negative result does not preclude SARS-Cov-2 infection and should not be used as the sole basis for treatment or other patient management decisions. A negative result may occur with  improper specimen collection/handling, submission of specimen other than  nasopharyngeal swab, presence of viral mutation(s) within the areas targeted by this assay, and inadequate number of viral copies(<138 copies/mL). A negative result must be combined with clinical observations, patient history, and epidemiological information. The expected result is Negative.  Fact Sheet for Patients:  EntrepreneurPulse.com.au  Fact Sheet for Healthcare Providers:  IncredibleEmployment.be  This test is no t yet approved or cleared by the [place] FDA and  has been authorized for detection and/or diagnosis of SARS-CoV-2 by FDA under an Emergency Use Authorization (EUA). This EUA will remain  in effect (meaning this test can be used) for the duration of the COVID-19 declaration under Section 564(b)(1) of the Act, 21 U.S.C.section 360bbb-3(b)(1), unless the authorization is terminated  or revoked sooner.       Influenza A by PCR NEGATIVE NEGATIVE   Influenza B by PCR NEGATIVE NEGATIVE    Comment: (NOTE) The Xpert Xpress SARS-CoV-2/FLU/RSV plus assay is intended as an aid in the diagnosis of influenza from Nasopharyngeal swab specimens and should not be used as a sole basis for treatment. Nasal washings and aspirates are unacceptable for Xpert Xpress SARS-CoV-2/FLU/RSV testing.  Fact Sheet for Patients: EntrepreneurPulse.com.au  Fact Sheet for Healthcare Providers: IncredibleEmployment.be  This test is not yet approved or cleared by the [place] FDA and has been authorized for detection and/or diagnosis of SARS-CoV-2 by FDA under an Emergency Use Authorization (EUA). This EUA will remain in effect (meaning this test can be used) for the duration of the COVID-19 declaration under Section 564(b)(1) of the Act, 21 U.S.C. section 360bbb-3(b)(1), unless the authorization is terminated or revoked.  Performed at [facility], [place] [address], [place], [place] [address]   POC SARS  Coronavirus 2 Ag-ED - Nasal Swab     Status: Normal (Preliminary result)   Collection Time: 07/01/21  5:18 AM  Result Value Ref Range   SARS Coronavirus 2 Ag Negative Negative  POCT Urine Drug Screen - (ICup)     Status: Normal   Collection Time: 07/01/21  5:19 AM  Result Value Ref Range   POC Amphetamine UR None Detected NONE DETECTED (Cut Off Level 1000 ng/mL)   POC Secobarbital (BAR) None Detected NONE DETECTED (Cut Off Level 300 ng/mL)   POC Buprenorphine (BUP) None Detected NONE DETECTED (Cut Off Level 10 ng/mL)   POC Oxazepam (BZO) None Detected NONE DETECTED (Cut Off Level 300 ng/mL)   POC Cocaine UR None Detected NONE DETECTED (Cut Off Level 300 ng/mL)   POC Methamphetamine UR None Detected NONE DETECTED (Cut Off Level 1000 ng/mL)   POC Morphine  None Detected NONE DETECTED (Cut Off Level 300 ng/mL)   POC Oxycodone UR None Detected NONE DETECTED (Cut Off Level 100 ng/mL)   POC Methadone UR None Detected NONE DETECTED (Cut Off Level 300 ng/mL)   POC Marijuana UR None Detected NONE DETECTED (Cut Off Level 50 ng/mL)  CBC with Differential/Platelet     Status: Abnormal   Collection Time: 07/01/21  5:21 AM  Result Value Ref Range   WBC 7.0 4.0 - 10.5 K/uL   RBC 5.64 4.22 - 5.81 MIL/uL   Hemoglobin 17.1 (H) 13.0 - 17.0 g/dL   HCT 47.2 39.0 - 52.0 %   MCV 83.7 80.0 - 100.0 fL   MCH 30.3 26.0 - 34.0 pg   MCHC 36.2 (H) 30.0 - 36.0 g/dL   RDW 12.9 11.5 - 15.5 %   Platelets 175 150 - 400 K/uL   nRBC 0.0 0.0 - 0.2 %   Neutrophils Relative % 70 %   Neutro Abs 4.8 1.7 - 7.7 K/uL   Lymphocytes Relative 22 %   Lymphs Abs 1.6 0.7 - 4.0 K/uL   Monocytes Relative 5 %   Monocytes Absolute 0.4 0.1 - 1.0 K/uL   Eosinophils Relative 1 %   Eosinophils Absolute 0.1 0.0 - 0.5 K/uL   Basophils Relative 0 %   Basophils Absolute 0.0 0.0 - 0.1 K/uL   Immature Granulocytes 2 %   Abs Immature Granulocytes 0.11 (H) 0.00 - 0.07 K/uL    Comment: Performed at [facility], [address],  [place], [place] [address]  Comprehensive metabolic panel     Status: None   Collection Time: 07/01/21  5:21 AM  Result Value Ref Range   Sodium 139 135 - 145 mmol/L   Potassium 3.5 3.5 - 5.1 mmol/L   Chloride 103 98 - 111 mmol/L   CO2 27 22 - 32 mmol/L   Glucose, Bld 94 70 - 99 mg/dL    Comment: Glucose reference range applies only to samples taken after fasting for at least 8 hours.   BUN 10 6 - 20 mg/dL   Creatinine, Ser 1.09 0.61 - 1.24 mg/dL   Calcium 9.5 8.9 - 10.3 mg/dL   Total Protein 7.0 6.5 - 8.1 g/dL   Albumin 4.3 3.5 - 5.0 g/dL   AST 26 15 - 41 U/L   ALT 33 0 - 44 U/L   Alkaline Phosphatase 74 38 - 126 U/L   Total Bilirubin 1.2 0.3 - 1.2 mg/dL   GFR, Estimated >60 >60 mL/min    Comment: (NOTE) Calculated using the CKD-EPI Creatinine Equation (2021)    Anion gap 9 5 - 15    Comment: Performed at [facility] [address] [place], [place] [address]  Ethanol     Status: None   Collection Time: 07/01/21  5:21 AM  Result Value Ref Range   Alcohol, Ethyl (B) <10 <10 mg/dL    Comment: (NOTE) Lowest detectable limit for serum alcohol is 10 mg/dL.  For medical purposes only. Performed at [facility], [place] [address], [place], [place] [address]   TSH     Status: None   Collection Time: 07/01/21  5:21 AM  Result Value Ref Range   TSH 3.717 0.350 - 4.500 uIU/mL    Comment: Performed by a 3rd Generation assay with a functional sensitivity of <=0.01 uIU/mL. Performed at [facility], [place] [address], [place], [place] [address]   Lipid panel  Status: Abnormal   Collection Time: 07/01/21  5:21 AM  Result Value Ref Range   Cholesterol 178 0 - 200 mg/dL   Triglycerides 96 <150 mg/dL   HDL 32 (L) >40 mg/dL   Total CHOL/HDL Ratio 5.6 RATIO   VLDL 19 0 - 40 mg/dL   LDL Cholesterol 127 (H) 0 - 99 mg/dL    Comment:        Total Cholesterol/HDL:CHD Risk Coronary Heart Disease Risk Table                     Men   Women  1/2 Average Risk   3.4   3.3   Average Risk       5.0   4.4  2 X Average Risk   9.6   7.1  3 X Average Risk  23.4   11.0        Use the calculated Patient Ratio above and the CHD Risk Table to determine the patient's CHD Risk.        ATP III CLASSIFICATION (LDL):  <100     mg/dL   Optimal  100-129  mg/dL   Near or Above                    Optimal  130-159  mg/dL   Borderline  160-189  mg/dL   High  >190     mg/dL   Very High Performed at [facility] [address], [place], [place] [address]   POC SARS Coronavirus 2 Ag     Status: None   Collection Time: 07/01/21  5:29 AM  Result Value Ref Range   SARSCOV2ONAVIRUS 2 AG NEGATIVE NEGATIVE    Comment: (NOTE) SARS-CoV-2 antigen NOT DETECTED.   Negative results are presumptive.  Negative results do not preclude SARS-CoV-2 infection and should not be used as the sole basis for treatment or other patient management decisions, including infection  control decisions, particularly in the presence of clinical signs and  symptoms consistent with COVID-19, or in those who have been in contact with the virus.  Negative results must be combined with clinical observations, patient history, and epidemiological information. The expected result is Negative.  Fact Sheet for Patients: HandmadeRecipes.com.cy  Fact Sheet for Healthcare Providers: FuneralLife.at  This test is not yet approved or cleared by the [place] FDA and  has been authorized for detection and/or diagnosis of SARS-CoV-2 by FDA under an Emergency Use Authorization (EUA).  This EUA will remain in effect (meaning this test can be used) for the duration of  the COV ID-19 declaration under Section 564(b)(1) of the Act, 21 U.S.C. section 360bbb-3(b)(1), unless the authorization is terminated or revoked sooner.      Blood Alcohol level:  Lab Results  Component Value Date   ETH <10 00/86/7619    Metabolic Disorder Labs:  No results found for:  HGBA1C, MPG No results found for: PROLACTIN Lab Results  Component Value Date   CHOL 178 07/01/2021   TRIG 96 07/01/2021   HDL 32 (L) 07/01/2021   CHOLHDL 5.6 07/01/2021   VLDL 19 07/01/2021   LDLCALC 127 (H) 07/01/2021    Current Medications: Current Facility-Administered Medications  Medication Dose Route Frequency Provider Last Rate Last Admin   acetaminophen (TYLENOL) tablet 650 mg  650 mg Oral Q6H PRN Revonda Humphrey, NP       alum & mag hydroxide-simeth (MAALOX/MYLANTA) 200-200-20 MG/5ML suspension 30 mL  30 mL Oral Q4H  PRN Revonda Humphrey, NP       Derrill Memo ON 07/02/2021] escitalopram (LEXAPRO) tablet 10 mg  10 mg Oral Daily Revonda Humphrey, NP       hydrOXYzine (ATARAX) tablet 25 mg  25 mg Oral TID PRN Revonda Humphrey, NP       OLANZapine zydis (ZYPREXA) disintegrating tablet 10 mg  10 mg Oral Q8H PRN Revonda Humphrey, NP       And   LORazepam (ATIVAN) tablet 1 mg  1 mg Oral PRN Revonda Humphrey, NP       And   ziprasidone (GEODON) injection 20 mg  20 mg Intramuscular PRN Revonda Humphrey, NP       magnesium hydroxide (MILK OF MAGNESIA) suspension 30 mL  30 mL Oral Daily PRN Revonda Humphrey, NP       traZODone (DESYREL) tablet 50 mg  50 mg Oral QHS PRN Revonda Humphrey, NP       PTA Medications: Medications Prior to Admission  Medication Sig Dispense Refill Last Dose   escitalopram (LEXAPRO) 10 MG tablet Take 0.5 tablets (5 mg total) by mouth daily.       Musculoskeletal: Strength & Muscle Tone: within normal limits Gait & Station: normal Patient leans: N/A            Psychiatric Specialty Exam:  Presentation  General Appearance: Appropriate for Environment; Casual  Eye Contact:Fair  Speech:Clear and Coherent; Normal Rate  Speech Volume:Normal  Handedness:Right   Mood and Affect  Mood:Anxious; Depressed; Hopeless  Affect:Tearful; Depressed; Congruent   Thought Process  Thought Processes:Coherent  Duration of Psychotic  Symptoms: No data recorded Past Diagnosis of Schizophrenia or Psychoactive disorder: No  Descriptions of Associations:Intact  Orientation:Full (Time, Place and Person)  Thought Content:Logical  Hallucinations:Hallucinations: None  Ideas of Reference:None  Suicidal Thoughts:Suicidal Thoughts: Yes, Active SI Active Intent and/or Plan: With Intent; With Plan; With Means to Carry Out SI Passive Intent and/or Plan: Without Intent; Without Plan  Homicidal Thoughts:Homicidal Thoughts: No   Sensorium  Memory:Immediate Good; Recent Good; Remote Good  Judgment:Poor  Insight:Poor   Executive Functions  Concentration:Good  Attention Span:Good  [place] of Knowledge:Good  Language:Good   Psychomotor Activity  Psychomotor Activity:Psychomotor Activity: Normal   Assets  Assets:Communication Skills; Desire for Improvement; Financial Resources/Insurance; Housing; Physical Health; Transportation; Vocational/Educational   Sleep  Sleep:Sleep: Poor Number of Hours of Sleep: 4    Physical Exam: Physical Exam Constitutional:      Appearance: Normal appearance.  HENT:     Head: Normocephalic and atraumatic.  Pulmonary:     Effort: Pulmonary effort is normal.  Skin:    General: Skin is dry.  Neurological:     Mental Status: He is alert and oriented to person, place, and time.   Review of Systems  Cardiovascular:  Negative for chest pain.  Neurological:  Positive for headaches.  Psychiatric/Behavioral:  Positive for depression. Negative for hallucinations, substance abuse and suicidal ideas. The patient has insomnia.   Blood pressure 136/63, pulse 72, temperature 98.2 F (36.8 C), temperature source Oral, resp. rate 18, height 6' (1.829 m), weight 117.9 kg, SpO2 99 %. Body mass index is 35.26 kg/m.  Treatment Plan Summary: Daily contact with patient to assess and evaluate symptoms and progress in treatment and Medication management Labs Reviewed: UDS(-),  CBC-Hgb 17.1, CMP,WNL, Etoh:(-), TSH: WNL, Lipid panel: HDL 32/LDL 127 Pending: A1c  Fredrico Tangeman is a 26 yo w/ no previous PPH who appears to  meet criteria for MDD, recurrent severe and has comorbid anxiety. Patient has had multiple stressors; however, patient's mom endorses hx of feeling overwhelmed and not able to maintain a job due to his mood. Patient is at increased risk genetically for mental health dx; despite FH of Bipolar he is not showing specific signs for this dx. Patient may see benefit from SSRI but will monitor for manic behavior or worsening SI as patient is still in the age range of brain development.   MDD, single episode, current. Severe w/o psychosis GAD - Continue Lexapro 38m  Encourage patient to participate in psychotherapy and interact in the mileu.  PRN -Tylenol [id number], pain -Maalox 3053m4h, indigestion -Atarax 38m24mD, anxiety -Milk of Mag 30mL11mnstipation -Trazodone 50mg 68m insomnia  Agitation Protocol: Zyprexa 10mg P50mh, Ativan 1mg, Ge58mn 20mg IM 26msician Treatment Plan for Primary Diagnosis: Adjustment disorder with mixed anxiety and depressed mood Long Term Goal(s): Improvement in symptoms so as ready for discharge  Short Term Goals: Ability to identify changes in lifestyle to reduce recurrence of condition will improve, Ability to verbalize feelings will improve, Ability to disclose and discuss suicidal ideas, Ability to demonstrate self-control will improve, Ability to identify and develop effective coping behaviors will improve, and Ability to identify triggers associated with substance abuse/mental health issues will improve  Physician Treatment Plan for Secondary Diagnosis: Principal Problem:   Adjustment disorder with mixed anxiety and depressed mood Active Problems:   Adjustment disorder with depressed mood  Long Term Goal(s): Improvement in symptoms so as ready for discharge  Short Term Goals: Ability to identify changes in  lifestyle to reduce recurrence of condition will improve, Ability to verbalize feelings will improve, Ability to disclose and discuss suicidal ideas, Ability to demonstrate self-control will improve, Ability to identify and develop effective coping behaviors will improve, and Ability to identify triggers associated with substance abuse/mental health issues will improve  I certify that inpatient services furnished can reasonably be expected to improve the patient's condition.    PGY-2 Takoda Siedlecki B McQFreida Busman/20232:23 PM

## 2021-07-01 NOTE — Progress Notes (Signed)
Patient is awake sitting on his bed.  He endorses depressive dymptoms witout anxiety at this time.  He denies avh shi or plan.  Patient appears dysphoric with minimal eye contact and soft voice.  Patient encouraged to seek out staff if overwhelmed by thoughts or feelings.  Will monitor and provide safe environment for him.

## 2021-07-01 NOTE — ED Provider Notes (Signed)
FBC/OBS ASAP Discharge Summary  Date and Time: 07/01/2021 10:35 AM  Name: Jared Hayes  MRN:  [id number]   Discharge Diagnoses:  Final diagnoses:  Adjustment disorder with depressed mood  Suicidal ideations    Subjective:  Jared Hayes, 26 y.o., male patient who initially presented to [facility] voluntarily with increased depression and SI.  He was admitted to the continuous assessment unit for observation.  He was assessed by this provider face-to-face, consulted with Dr. Bronwen Betters; and  chart reviewed on 07/01/21.  Patient recently obtained services with [email or web address] and was prescribed Lexapro 5 mg daily reports he has only taken a couple of doses.  He has counseling services in place with [facility].  He denies any substance use.  UDS and BAL was negative.  Reports last night his anxiety and depression have increased and he began to have SI.  His ex girlfriend who he considers his only support brought him to [facility] for assessment.  During evaluation Indigo A Olivar is in sitting position.  He is alert/oriented x4 and cooperative.  His speech is clear and coherent and he makes fair eye contact.  He was tearful at times.  He endorses increased depression and anxiety.  His affect is depressed.  He endorses feelings of hopelessness, helplessness, decreased concentration, and fatigue.  Recent triggers include the death of his 2 dogs, loss of a relationship, no support from his family, working third shift and going to school full-time.  States, "I feel stuck, like I am lost I dont know what to do with my life". He sleeps 3-4 hours per night. He reports a 60 pound weight loss in the past 5 months.  He contributes most of his weight loss to decreased appetite, but he has been going to the gym more often.  He does not appear to be responding to internal/external stimuli.  He denies AVH/HI.  He continues to endorse suicidal ideations.  He admits, "I have thought about it and there are guns in  the house that is why I moved them to my grandparents room".  Patient reports the guns are not locked away, he continues to have access to them.  When asked if he felt like he can keep himself safe he states, "I just do not know".  He cannot contract for safety.  Discussed inpatient psychiatric admission with patient explained the milieu.  He is in agreement   Stay Summary:   Patient remained calm and cooperative while on the milieu.  He continues to report SI.  He cannot contract for safety.  He meets criteria for inpatient psychiatric admission.  Patient is in agreement.  [facility] notified, patient has been accepted.  He will be transferred via safe transport.  Total Time spent with patient: 30 minutes  Past Psychiatric History: See H&P Past Medical History: History reviewed. No pertinent past medical history. History reviewed. No pertinent surgical history. Family History:  Family History  Problem Relation Age of Onset   Diabetes Mother    Diabetes Father    Family Psychiatric History: See H&P Social History:  Social History   Substance and Sexual Activity  Alcohol Use Yes   Comment: occ     Social History   Substance and Sexual Activity  Drug Use Not Currently   Types: Marijuana    Social History   Socioeconomic History   Marital status: Single    Spouse name: Not on file   Number of children: Not on file  Years of education: Not on file   Highest education level: Not on file  Occupational History   Not on file  Tobacco Use   Smoking status: Never   Smokeless tobacco: Never  Vaping Use   Vaping Use: Never used  Substance and Sexual Activity   Alcohol use: Yes    Comment: occ   Drug use: Not Currently    Types: Marijuana   Sexual activity: Not on file  Other Topics Concern   Not on file  Social History Narrative   Not on file   Social Determinants of Health   Financial Resource Strain: Not on file  Food Insecurity: Not on file  Transportation Needs:  Not on file  Physical Activity: Not on file  Stress: Not on file  Social Connections: Not on file   SDOH:  SDOH Screenings   Alcohol Screen: Not on file  Depression (PHQ2-9): Medium Risk   PHQ-2 Score: 18  Financial Resource Strain: Not on file  Food Insecurity: Not on file  Housing: Not on file  Physical Activity: Not on file  Social Connections: Not on file  Stress: Not on file  Tobacco Use: Low Risk    Smoking Tobacco Use: Never   Smokeless Tobacco Use: Never   Passive Exposure: Not on file  Transportation Needs: Not on file    Tobacco Cessation:  N/A, patient does not currently use tobacco products  Current Medications:  Current Facility-Administered Medications  Medication Dose Route Frequency Provider Last Rate Last Admin   acetaminophen (TYLENOL) tablet 650 mg  650 mg Oral Q6H PRN Jackelyn PolingBerry, Jason A, NP       alum & mag hydroxide-simeth (MAALOX/MYLANTA) 200-200-20 MG/5ML suspension 30 mL  30 mL Oral Q4H PRN Nira ConnBerry, Jason A, NP       escitalopram (LEXAPRO) tablet 10 mg  10 mg Oral Daily Nira ConnBerry, Jason A, NP   10 mg at 07/01/21 [id number]   hydrOXYzine (ATARAX) tablet 25 mg  25 mg Oral TID PRN Jackelyn PolingBerry, Jason A, NP       magnesium hydroxide (MILK OF MAGNESIA) suspension 30 mL  30 mL Oral Daily PRN Jackelyn PolingBerry, Jason A, NP       traZODone (DESYREL) tablet 50 mg  50 mg Oral QHS PRN Jackelyn PolingBerry, Jason A, NP       Current Outpatient Medications  Medication Sig Dispense Refill   escitalopram (LEXAPRO) 5 MG tablet Take 5 mg by mouth daily.      PTA Medications: (Not in a hospital admission)   Musculoskeletal  Strength & Muscle Tone: within normal limits Gait & Station: normal Patient leans: N/A  Psychiatric Specialty Exam  Presentation  General Appearance: Appropriate for Environment; Casual  Eye Contact:Fair  Speech:Clear and Coherent; Normal Rate  Speech Volume:Normal  Handedness:Right   Mood and Affect  Mood:Anxious; Depressed; Hopeless  Affect:Tearful; Depressed;  Congruent   Thought Process  Thought Processes:Coherent  Descriptions of Associations:Intact  Orientation:Full (Time, Place and Person)  Thought Content:Logical  Diagnosis of Schizophrenia or Schizoaffective disorder in past: No    Hallucinations:Hallucinations: None  Ideas of Reference:None  Suicidal Thoughts:Suicidal Thoughts: Yes, Active SI Active Intent and/or Plan: With Intent; With Plan; With Means to Carry Out SI Passive Intent and/or Plan: Without Intent; Without Plan  Homicidal Thoughts:Homicidal Thoughts: No   Sensorium  Memory:Immediate Good; Recent Good; Remote Good  Judgment:Poor  Insight:Poor   Executive Functions  Concentration:Good  Attention Span:Good  Recall:Good  Fund of Knowledge:Good  Language:Good   Psychomotor Activity  Psychomotor Activity:Psychomotor  Activity: Normal   Assets  Assets:Communication Skills; Desire for Improvement; Financial Resources/Insurance; Housing; Physical Health; Transportation; Vocational/Educational   Sleep  Sleep:Sleep: Poor Number of Hours of Sleep: 4   Nutritional Assessment (For OBS and FBC admissions only) Has the patient had a weight loss or gain of 10 pounds or more in the last 3 months?: No Has the patient had a decrease in food intake/or appetite?: No Does the patient have dental problems?: No Does the patient have eating habits or behaviors that may be indicators of an eating disorder including binging or inducing vomiting?: No Has the patient recently lost weight without trying?: 0 Has the patient been eating poorly because of a decreased appetite?: 0 Malnutrition Screening Tool Score: 0    Physical Exam  Physical Exam Vitals and nursing note reviewed.  Constitutional:      General: He is not in acute distress.    Appearance: Normal appearance. He is well-developed.  HENT:     Head: Normocephalic and atraumatic.  Eyes:     General:        Right eye: No discharge.        Left eye:  No discharge.     Conjunctiva/sclera: Conjunctivae normal.  Cardiovascular:     Rate and Rhythm: Normal rate.  Pulmonary:     Effort: Pulmonary effort is normal. No respiratory distress.  Musculoskeletal:        General: Normal range of motion.     Cervical back: Normal range of motion.  Skin:    Coloration: Skin is not jaundiced.  Neurological:     Mental Status: He is alert and oriented to person, place, and time.  Psychiatric:        Attention and Perception: Attention and perception normal.        Mood and Affect: Mood is anxious and depressed. Affect is tearful.        Speech: Speech normal.        Behavior: Behavior normal. Behavior is cooperative.        Thought Content: Thought content includes suicidal ideation. Thought content includes suicidal plan.        Cognition and Memory: Cognition normal.        Judgment: Judgment is impulsive.   Review of Systems  Constitutional: Negative.   HENT: Negative.    Eyes: Negative.   Respiratory: Negative.    Cardiovascular: Negative.   Musculoskeletal: Negative.   Skin: Negative.   Neurological: Negative.   Psychiatric/Behavioral:  Positive for depression and suicidal ideas. The patient is nervous/anxious.   Blood pressure 122/70, pulse 83, temperature 98.8 F (37.1 C), temperature source Oral, resp. rate 20, SpO2 97 %. There is no height or weight on file to calculate BMI.  Demographic Factors:  Male, Adolescent or young adult, Caucasian, and Access to firearms  Loss Factors: Loss of significant relationship  Historical Factors: Impulsivity  Risk Reduction Factors:   Sense of responsibility to family, Living with another person, especially a relative, Positive social support, Positive therapeutic relationship, and Positive coping skills or problem solving skills  Continued Clinical Symptoms:  Severe Anxiety and/or Agitation Depression:   Hopelessness Impulsivity  Cognitive Features That Contribute To Risk:  None     Suicide Risk:  Severe:  Frequent, intense, and enduring suicidal ideation, specific plan, no subjective intent, but some objective markers of intent (i.e., choice of lethal method), the method is accessible, some limited preparatory behavior, evidence of impaired self-control, severe dysphoria/symptomatology, multiple risk factors present, and few  if any protective factors, particularly a lack of social support.  Plan Of Care/Follow-up recommendations:  Activity:  as tolerated  Diet:  regular   Disposition:   Patient meets criteria for inpatient psychiatric admission.  He is SI and he cannot contract for safety  [facility] notified and patient has been accepted.  He will be transferred via safe transport.  Ardis Hughsarolyn H Griffyn Kucinski, NP 07/01/2021, 10:35 AM

## 2021-07-01 NOTE — Progress Notes (Signed)
Patient ID: Jared Hayes, male   DOB: 1995/10/04, 26 y.o.   MRN: [id number] Admission note: Patient is a 26 yo male admitted to [facility] voluntarily for depression, SI and anxiety. Patient states he is mainly here for anxiety. Patient lives in [place], [place] for grandparents and parents. Patient was a walk-in with his girlfriend at [facility]. He has counseling services with [facility]. Patient states he was having SI without a specific plan. Patient presents with flat, blunted affect and depressed mood. He currently denies any SI. He states his stressors are the death of two dogs and loss of a relationship. Patient has not been sleeping; he has been worrying and rates his anxiety at a 4. He reports a 60-lb weight loss in the last 5 months. Patient states he works third shift and goes to school. He gets frustrated with his family because "when I try and tell them how I'm feeling, they will say you'll be ok." Per assessment, patient does have weapons in the home. He denies any AVH. Patient was oriented to unit and room. This is his first psych admission.

## 2021-07-01 NOTE — Discharge Instructions (Addendum)
Transfer patient to [facility] for inpatient psychiatric admission. Accepting physician is Dr. Berdine Addison

## 2021-07-02 ENCOUNTER — Encounter (HOSPITAL_COMMUNITY): Payer: Self-pay

## 2021-07-02 DIAGNOSIS — F322 Major depressive disorder, single episode, severe without psychotic features: Principal | ICD-10-CM

## 2021-07-02 MED ORDER — ESCITALOPRAM OXALATE 10 MG PO TABS
10.0000 mg | ORAL_TABLET | Freq: Every day | ORAL | Status: AC
  Administered 2021-07-02: 10 mg via ORAL
  Filled 2021-07-02 (×2): qty 1

## 2021-07-02 MED ORDER — ESCITALOPRAM OXALATE 20 MG PO TABS
20.0000 mg | ORAL_TABLET | Freq: Every day | ORAL | Status: DC
  Administered 2021-07-03 – 2021-07-05 (×3): 20 mg via ORAL
  Filled 2021-07-02 (×5): qty 1

## 2021-07-02 NOTE — Progress Notes (Signed)
[facility] MD Progress Note  07/02/2021 3:02 PM Jared Hayes  MRN:  [id number] Subjective:  Jared Hayes is a 26 yo patient w/ no PPH who presented to [facility] endorsing SI. Patient continued to endorse depressed mood and was transferred to [facility].   Case was discussed in the multidisciplinary team. MAR was reviewed and patient was compliant with medications.  He did not require any PRN's for agitation.     Psychiatric Team made the following recommendations yesterday:  - Continue Lexapro 10mg   On assessment this a.m. patient reports that he spoke with his mom yesterday and the conversation overall went well.  Patient reports that he slept well last night and his appetite continues to be poor.  Patient reports that this a.m. he feels that his mood is still "low" and his anxiety is "high."  Patient reports that he is not quite sure why he is anxious.  Patient and provider discuss patient's feeling abandoned by family members and people in his life.  Patient endorses that it is difficult for him to reach out to others and he has been nervous and confused about how to make new friends.  Patient endorses he also struggles with self-doubt when individuals do not respond to his text as rapidly as he does for others.  Patient endorses that he feels that some of the groups have been beneficial in helping him understand how to manage his anxiety and also practice his social skills.  Patient denies SI, HI and AVH on assessment today.  Patient reports that he does not have any negative side effects from medication.  Principal Problem: MDD (major depressive disorder), single episode, severe , no psychosis ([place]) Diagnosis: Principal Problem:   MDD (major depressive disorder), single episode, severe , no psychosis ([place]) Active Problems:   Adjustment disorder with depressed mood   Generalized anxiety disorder  Total Time spent with patient: 20 minutes  Past Psychiatric History: See H&P  Past Medical  History: History reviewed. No pertinent past medical history. History reviewed. No pertinent surgical history. Family History:  Family History  Problem Relation Age of Onset   Diabetes Mother    Diabetes Father    Family Psychiatric  History: See H&P Social History:  Social History   Substance and Sexual Activity  Alcohol Use Yes   Comment: occ     Social History   Substance and Sexual Activity  Drug Use Not Currently   Types: Marijuana    Social History   Socioeconomic History   Marital status: Single    Spouse name: Not on file   Number of children: Not on file   Years of education: Not on file   Highest education level: Not on file  Occupational History   Not on file  Tobacco Use   Smoking status: Never    Passive exposure: Never   Smokeless tobacco: Never  Vaping Use   Vaping Use: Never used  Substance and Sexual Activity   Alcohol use: Yes    Comment: occ   Drug use: Not Currently    Types: Marijuana   Sexual activity: Not on file  Other Topics Concern   Not on file  Social History Narrative   Not on file   Social Determinants of Health   Financial Resource Strain: Not on file  Food Insecurity: Not on file  Transportation Needs: Not on file  Physical Activity: Not on file  Stress: Not on file  Social Connections: Not on file   Additional Social History:  Sleep: Fair  Appetite:  Poor  Current Medications: Current Facility-Administered Medications  Medication Dose Route Frequency Provider Last Rate Last Admin   acetaminophen (TYLENOL) tablet 650 mg  650 mg Oral Q6H PRN Revonda Humphrey, NP   650 mg at 07/01/21 1807   alum & mag hydroxide-simeth (MAALOX/MYLANTA) 200-200-20 MG/5ML suspension 30 mL  30 mL Oral Q4H PRN Revonda Humphrey, NP       escitalopram (LEXAPRO) tablet 10 mg  10 mg Oral Daily Revonda Humphrey, NP   10 mg at 07/02/21 0743   hydrOXYzine (ATARAX) tablet 25 mg  25 mg Oral TID PRN Revonda Humphrey, NP   25 mg at 07/02/21 0744   OLANZapine zydis (ZYPREXA) disintegrating tablet 10 mg  10 mg Oral Q8H PRN Revonda Humphrey, NP   10 mg at 07/02/21 1407   And   LORazepam (ATIVAN) tablet 1 mg  1 mg Oral PRN Revonda Humphrey, NP       And   ziprasidone (GEODON) injection 20 mg  20 mg Intramuscular PRN Revonda Humphrey, NP       magnesium hydroxide (MILK OF MAGNESIA) suspension 30 mL  30 mL Oral Daily PRN Revonda Humphrey, NP       traZODone (DESYREL) tablet 50 mg  50 mg Oral QHS PRN Revonda Humphrey, NP   50 mg at 07/01/21 2116    Lab Results:  Results for orders placed or performed during the hospital encounter of 07/01/21 (from the past 48 hour(s))  Resp Panel by RT-PCR (Flu A&B, Covid) Nasopharyngeal Swab     Status: None   Collection Time: 07/01/21  5:17 AM   Specimen: Nasopharyngeal Swab; Nasopharyngeal(NP) swabs in vial transport medium  Result Value Ref Range   SARS Coronavirus 2 by RT PCR NEGATIVE NEGATIVE    Comment: (NOTE) SARS-CoV-2 target nucleic acids are NOT DETECTED.  The SARS-CoV-2 RNA is generally detectable in upper respiratory specimens during the acute phase of infection. The lowest concentration of SARS-CoV-2 viral copies this assay can detect is 138 copies/mL. A negative result does not preclude SARS-Cov-2 infection and should not be used as the sole basis for treatment or other patient management decisions. A negative result may occur with  improper specimen collection/handling, submission of specimen other than nasopharyngeal swab, presence of viral mutation(s) within the areas targeted by this assay, and inadequate number of viral copies(<138 copies/mL). A negative result must be combined with clinical observations, patient history, and epidemiological information. The expected result is Negative.  Fact Sheet for Patients:  EntrepreneurPulse.com.au  Fact Sheet for Healthcare Providers:   IncredibleEmployment.be  This test is no t yet approved or cleared by the [place] FDA and  has been authorized for detection and/or diagnosis of SARS-CoV-2 by FDA under an Emergency Use Authorization (EUA). This EUA will remain  in effect (meaning this test can be used) for the duration of the COVID-19 declaration under Section 564(b)(1) of the Act, 21 U.S.C.section 360bbb-3(b)(1), unless the authorization is terminated  or revoked sooner.       Influenza A by PCR NEGATIVE NEGATIVE   Influenza B by PCR NEGATIVE NEGATIVE    Comment: (NOTE) The Xpert Xpress SARS-CoV-2/FLU/RSV plus assay is intended as an aid in the diagnosis of influenza from Nasopharyngeal swab specimens and should not be used as a sole basis for treatment. Nasal washings and aspirates are unacceptable for Xpert Xpress SARS-CoV-2/FLU/RSV testing.  Fact Sheet for Patients: EntrepreneurPulse.com.au  Fact Sheet for Healthcare  Providers: IncredibleEmployment.be  This test is not yet approved or cleared by the [place] and has been authorized for detection and/or diagnosis of SARS-CoV-2 by FDA under an Emergency Use Authorization (EUA). This EUA will remain in effect (meaning this test can be used) for the duration of the COVID-19 declaration under Section 564(b)(1) of the Act, 21 U.S.C. section 360bbb-3(b)(1), unless the authorization is terminated or revoked.  Performed at [facility], [place] [address], [place], [place] [address]   POC SARS Coronavirus 2 Ag-ED - Nasal Swab     Status: Normal (Preliminary result)   Collection Time: 07/01/21  5:18 AM  Result Value Ref Range   SARS Coronavirus 2 Ag Negative Negative  POCT Urine Drug Screen - (ICup)     Status: Normal   Collection Time: 07/01/21  5:19 AM  Result Value Ref Range   POC Amphetamine UR None Detected NONE DETECTED (Cut Off Level 1000 ng/mL)   POC Secobarbital (BAR) None  Detected NONE DETECTED (Cut Off Level 300 ng/mL)   POC Buprenorphine (BUP) None Detected NONE DETECTED (Cut Off Level 10 ng/mL)   POC Oxazepam (BZO) None Detected NONE DETECTED (Cut Off Level 300 ng/mL)   POC Cocaine UR None Detected NONE DETECTED (Cut Off Level 300 ng/mL)   POC Methamphetamine UR None Detected NONE DETECTED (Cut Off Level 1000 ng/mL)   POC Morphine None Detected NONE DETECTED (Cut Off Level 300 ng/mL)   POC Oxycodone UR None Detected NONE DETECTED (Cut Off Level 100 ng/mL)   POC Methadone UR None Detected NONE DETECTED (Cut Off Level 300 ng/mL)   POC Marijuana UR None Detected NONE DETECTED (Cut Off Level 50 ng/mL)  CBC with Differential/Platelet     Status: Abnormal   Collection Time: 07/01/21  5:21 AM  Result Value Ref Range   WBC 7.0 4.0 - 10.5 K/uL   RBC 5.64 4.22 - 5.81 MIL/uL   Hemoglobin 17.1 (H) 13.0 - 17.0 g/dL   HCT 47.2 39.0 - 52.0 %   MCV 83.7 80.0 - 100.0 fL   MCH 30.3 26.0 - 34.0 pg   MCHC 36.2 (H) 30.0 - 36.0 g/dL   RDW 12.9 11.5 - 15.5 %   Platelets 175 150 - 400 K/uL   nRBC 0.0 0.0 - 0.2 %   Neutrophils Relative % 70 %   Neutro Abs 4.8 1.7 - 7.7 K/uL   Lymphocytes Relative 22 %   Lymphs Abs 1.6 0.7 - 4.0 K/uL   Monocytes Relative 5 %   Monocytes Absolute 0.4 0.1 - 1.0 K/uL   Eosinophils Relative 1 %   Eosinophils Absolute 0.1 0.0 - 0.5 K/uL   Basophils Relative 0 %   Basophils Absolute 0.0 0.0 - 0.1 K/uL   Immature Granulocytes 2 %   Abs Immature Granulocytes 0.11 (H) 0.00 - 0.07 K/uL    Comment: Performed at [facility], [place] [address], [place], [place] [address]  Comprehensive metabolic panel     Status: None   Collection Time: 07/01/21  5:21 AM  Result Value Ref Range   Sodium 139 135 - 145 mmol/L   Potassium 3.5 3.5 - 5.1 mmol/L   Chloride 103 98 - 111 mmol/L   CO2 27 22 - 32 mmol/L   Glucose, Bld 94 70 - 99 mg/dL    Comment: Glucose reference range applies only to samples taken after fasting for at least 8 hours.   BUN 10 6 -  20 mg/dL   Creatinine, Ser  1.09 0.61 - 1.24 mg/dL   Calcium 9.5 8.9 - 10.3 mg/dL   Total Protein 7.0 6.5 - 8.1 g/dL   Albumin 4.3 3.5 - 5.0 g/dL   AST 26 15 - 41 U/L   ALT 33 0 - 44 U/L   Alkaline Phosphatase 74 38 - 126 U/L   Total Bilirubin 1.2 0.3 - 1.2 mg/dL   GFR, Estimated >60 >60 mL/min    Comment: (NOTE) Calculated using the CKD-EPI Creatinine Equation (2021)    Anion gap 9 5 - 15    Comment: Performed at [facility] [address] [place], [place] [address]  Hemoglobin A1c     Status: None   Collection Time: 07/01/21  5:21 AM  Result Value Ref Range   Hgb A1c MFr Bld 4.9 4.8 - 5.6 %    Comment: (NOTE)         Prediabetes: 5.7 - 6.4         Diabetes: >6.4         Glycemic control for adults with diabetes: <7.0    Mean Plasma Glucose 94 mg/dL    Comment: (NOTE) Performed At: [facility] [place], [place] [id number] Rush Farmer MD [id number]   Ethanol     Status: None   Collection Time: 07/01/21  5:21 AM  Result Value Ref Range   Alcohol, Ethyl (B) <10 <10 mg/dL    Comment: (NOTE) Lowest detectable limit for serum alcohol is 10 mg/dL.  For medical purposes only. Performed at [facility], [place] [address] [place], [place] [address]   TSH     Status: None   Collection Time: 07/01/21  5:21 AM  Result Value Ref Range   TSH 3.717 0.350 - 4.500 uIU/mL    Comment: Performed by a 3rd Generation assay with a functional sensitivity of <=0.01 uIU/mL. Performed at [facility], [place] [address], [place], [place] [address]   Lipid panel     Status: Abnormal   Collection Time: 07/01/21  5:21 AM  Result Value Ref Range   Cholesterol 178 0 - 200 mg/dL   Triglycerides 96 <150 mg/dL   HDL 32 (L) >40 mg/dL   Total CHOL/HDL Ratio 5.6 RATIO   VLDL 19 0 - 40 mg/dL   LDL Cholesterol 127 (H) 0 - 99 mg/dL    Comment:        Total Cholesterol/HDL:CHD Risk Coronary Heart Disease Risk Table                     Men   Women  1/2  Average Risk   3.4   3.3  Average Risk       5.0   4.4  2 X Average Risk   9.6   7.1  3 X Average Risk  23.4   11.0        Use the calculated Patient Ratio above and the CHD Risk Table to determine the patient's CHD Risk.        ATP III CLASSIFICATION (LDL):  <100     mg/dL   Optimal  100-129  mg/dL   Near or Above                    Optimal  130-159  mg/dL   Borderline  160-189  mg/dL   High  >190     mg/dL   Very High Performed at [facility] [address]  [place], [place] [address]   POC SARS Coronavirus 2 Ag     Status: None   Collection Time: 07/01/21  5:29 AM  Result Value Ref Range   SARSCOV2ONAVIRUS 2 AG NEGATIVE NEGATIVE    Comment: (NOTE) SARS-CoV-2 antigen NOT DETECTED.   Negative results are presumptive.  Negative results do not preclude SARS-CoV-2 infection and should not be used as the sole basis for treatment or other patient management decisions, including infection  control decisions, particularly in the presence of clinical signs and  symptoms consistent with COVID-19, or in those who have been in contact with the virus.  Negative results must be combined with clinical observations, patient history, and epidemiological information. The expected result is Negative.  Fact Sheet for Patients: HandmadeRecipes.com.cy  Fact Sheet for Healthcare Providers: FuneralLife.at  This test is not yet approved or cleared by the [place] FDA and  has been authorized for detection and/or diagnosis of SARS-CoV-2 by FDA under an Emergency Use Authorization (EUA).  This EUA will remain in effect (meaning this test can be used) for the duration of  the COV ID-19 declaration under Section 564(b)(1) of the Act, 21 U.S.C. section 360bbb-3(b)(1), unless the authorization is terminated or revoked sooner.      Blood Alcohol level:  Lab Results  Component Value Date   ETH <10 123XX123    Metabolic Disorder  Labs: Lab Results  Component Value Date   HGBA1C 4.9 07/01/2021   MPG 94 07/01/2021   No results found for: PROLACTIN Lab Results  Component Value Date   CHOL 178 07/01/2021   TRIG 96 07/01/2021   HDL 32 (L) 07/01/2021   CHOLHDL 5.6 07/01/2021   VLDL 19 07/01/2021   LDLCALC 127 (H) 07/01/2021    Physical Findings: AIMS: Facial and Oral Movements Muscles of Facial Expression: None, normal Lips and Perioral Area: None, normal Jaw: None, normal Tongue: None, normal,Extremity Movements Upper (arms, wrists, hands, fingers): None, normal Lower (legs, knees, ankles, toes): None, normal, Trunk Movements Neck, shoulders, hips: None, normal, Overall Severity Severity of abnormal movements (highest score from questions above): None, normal Incapacitation due to abnormal movements: None, normal Patient's awareness of abnormal movements (rate only patient's report): No Awareness, Dental Status Current problems with teeth and/or dentures?: No Does patient usually wear dentures?: No  CIWA:    COWS:     Musculoskeletal: Strength & Muscle Tone: within normal limits Gait & Station: normal Patient leans: N/A  Psychiatric Specialty Exam:  Presentation  General Appearance: Appropriate for Environment  Eye Contact:Fair  Speech:Clear and Coherent  Speech Volume:Normal  Handedness:Right   Mood and Affect  Mood:Dysphoric; Anxious  Affect:Congruent   Thought Process  Thought Processes:Coherent  Descriptions of Associations:Intact  Orientation:Full (Time, Place and Person)  Thought Content:Logical  History of Schizophrenia/Schizoaffective disorder:No  Duration of Psychotic Symptoms:No data recorded Hallucinations:Hallucinations: None  Ideas of Reference:None  Suicidal Thoughts:Suicidal Thoughts: No SI Active Intent and/or Plan: With Intent; With Plan; With Means to Carry Out SI Passive Intent and/or Plan: Without Intent; Without Plan  Homicidal Thoughts:Homicidal  Thoughts: No   Sensorium  Memory:Immediate Fair; Recent Fair  Judgment:Impaired  Insight:Shallow   Executive Functions  Concentration:Fair  Attention Span:Fair  [place]   Psychomotor Activity  Psychomotor Activity:Psychomotor Activity: Decreased   Assets  Assets:Desire for Improvement; Resilience; Housing   Sleep  Sleep:Sleep: Poor Number of Hours of Sleep: 4    Physical Exam: Physical Exam Constitutional:      Appearance: Normal appearance.  HENT:     Head: Normocephalic and atraumatic.  Pulmonary:     Effort: Pulmonary effort is normal.  Neurological:     Mental Status: He is alert and oriented to person, place, and time.   Review of Systems  Psychiatric/Behavioral:  Positive for depression. Negative for hallucinations and suicidal ideas. The patient is nervous/anxious.   Blood pressure 106/65, pulse 67, temperature 97.7 F (36.5 C), temperature source Oral, resp. rate 18, height 6' (1.829 m), weight 117.9 kg, SpO2 98 %. Body mass index is 35.26 kg/m.   Treatment Plan Summary: Daily contact with patient to assess and evaluate symptoms and progress in treatment and Medication management Jared Hayes is a 25 yo w/ no previous [place] who appears to meet criteria for MDD, recurrent severe and has comorbid anxiety.  Patient continues to overall appear with a depressed affect and dysphoric mood.  MDD, single episode, current. Severe w/o psychosis GAD -Increase Lexapro to 20mg    Encourage patient to participate in psychotherapy and interact in the mileu.   PRN -Tylenol 650mg  q6h, pain -Maalox 20ml q4h, indigestion -Atarax 25mg  TID, anxiety -Milk of Mag 51mL, constipation -Trazodone 50mg  QHS, insomnia  Agitation Protocol: Zyprexa 10mg  PO q8h, Ativan 1mg , Geodon 20mg  IM   Physician Treatment Plan for Primary Diagnosis: Adjustment disorder with mixed anxiety and depressed mood Long Term Goal(s): Improvement  in symptoms so as ready for discharge   Short Term Goals: Ability to identify changes in lifestyle to reduce recurrence of condition will improve, Ability to verbalize feelings will improve, Ability to disclose and discuss suicidal ideas, Ability to demonstrate self-control will improve, Ability to identify and develop effective coping behaviors will improve, and Ability to identify triggers associated with substance abuse/mental health issues will improve   Physician Treatment Plan for Secondary Diagnosis: Principal Problem:     PGY-2 Freida Busman, MD 07/02/2021, 3:02 PM

## 2021-07-02 NOTE — [facility] Group Notes (Signed)
Adult Psychoeducational Group Note  Date:  07/02/2021 Time:  2:49 PM  Group Topic/Focus:  Wellness Toolbox:   The focus of this group is to discuss various aspects of wellness, balancing those aspects and exploring ways to increase the ability to experience wellness.  Patients will create a wellness toolbox for use upon discharge.  Participation Level:  Active  Participation Quality:  Appropriate  Affect:  Appropriate  Cognitive:  Appropriate  Insight: Appropriate  Engagement in Group:  Engaged  Modes of Intervention:  Activity  Additional Comments:  Patient attended and participated in the relaxation group activity.  Jearl Klinefelter 07/02/2021, 2:49 PM

## 2021-07-02 NOTE — Progress Notes (Signed)
Progress note  Pt found in bed; compliant with medication administration. Pt has c/o of anxiety and worrying. Pt has been seen pacing in the hallway. Pt seems to be vested in their progress, as they had questions regarding their follow up care and the groups they had attended. Pt states they feel they are anxious because they usually run at home to release stress but can't here. Pt was offered alternatives for stress relief and to build coping skills. Pt stated they would be open to trying these. Pt denies si/hi/ah/vh and verbally agrees to approach staff if these become apparent or before harming themselves/others while at [place] [facility].  A: Pt provided support and encouragement. Pt given medication per protocol and standing orders. Q27m safety checks implemented and continued.  R: Pt safe on the unit. Will continue to monitor.

## 2021-07-02 NOTE — Plan of Care (Signed)
  Problem: Education: Goal: Knowledge of [facility] Education information/materials will improve Outcome: Progressing Goal: Emotional status will improve Outcome: Progressing Goal: Mental status will improve Outcome: Progressing   

## 2021-07-02 NOTE — Progress Notes (Signed)
Patient denies SI/HI/A/VH endorses depression 6/10 this shift. Compliant with medications.   Scheduled medications administered per Provider order. Support and encouragement provided. Routine safety checks conducted every 15 minutes. Patient notified to inform staff with problems or concerns.  No adverse drug reactions noted. Patient contracts for safety at this time. Will continue to monitor.

## 2021-07-02 NOTE — Group Note (Signed)
Date:  07/02/2021 Time:  10:40 AM  Group Topic/Focus:  Orientation:   The focus of this group is to educate the patient on the purpose and policies of crisis stabilization and provide a format to answer questions about their admission.  The group details unit policies and expectations of patients while admitted.    Participation Level:  Active  Participation Quality:  Appropriate  Affect:  Appropriate  Cognitive:  Appropriate  Insight: Appropriate  Engagement in Group:  Improving  Modes of Intervention:  Discussion  Additional Comments:     Jerrye Beavers 07/02/2021, 10:40 AM

## 2021-07-02 NOTE — Group Note (Signed)
Type of Therapy and Topic:  Group Therapy:  Stress Management   Participation Level:  Active    Description of Group:  Patients in this group were introduced to the idea of stress and encouraged to discuss negative and positive ways to manage stress. Patients discussed specific stressors that they have in their life right now and the physical signs and symptoms associated with that stress.  Patient encouraged to come up with positive changes to assist with the stress upon discharge in order to prevent future hospitalizations.   They also worked as a group on developing a specific plan for several patients to deal with stressors through boundary-setting, psychoeducation and self care techniques   Therapeutic Goals:               1)  To discuss the positive and negative impacts of stress             2)  identify signs and symptoms of stress             3)  generate ideas for stress management             4)  offer mutual support to others regarding stress management             5)  Developing plans for ways to manage specific stressors upon discharge               Summary of Patient Progress:  Patient was appropriate in group.  Patient discussed overcoming acting like everything was okay.  Patient participated appropriately in circle of control activity and was open to feedback from peers.     Therapeutic Modalities:   Motivational Interviewing Brief Solution-Focused Therapy

## 2021-07-02 NOTE — BH IP Treatment Plan (Signed)
Interdisciplinary Treatment and Diagnostic Plan Update  07/02/2021 Jared Hayes MRN: [id number]  Principal Diagnosis: MDD (major depressive disorder), single episode, severe , no psychosis ([place])  Secondary Diagnoses: Principal Problem:   MDD (major depressive disorder), single episode, severe , no psychosis ([place]) Active Problems:   Adjustment disorder with depressed mood   Generalized anxiety disorder   Current Medications:  Current Facility-Administered Medications  Medication Dose Route Frequency Provider Last Rate Last Admin   acetaminophen (TYLENOL) tablet 650 mg  650 mg Oral Q6H PRN Revonda Humphrey, NP   650 mg at 07/01/21 1807   alum & mag hydroxide-simeth (MAALOX/MYLANTA) 200-200-20 MG/5ML suspension 30 mL  30 mL Oral Q4H PRN Revonda Humphrey, NP       escitalopram (LEXAPRO) tablet 10 mg  10 mg Oral Daily Revonda Humphrey, NP   10 mg at 07/02/21 0743   hydrOXYzine (ATARAX) tablet 25 mg  25 mg Oral TID PRN Revonda Humphrey, NP   25 mg at 07/02/21 0744   OLANZapine zydis (ZYPREXA) disintegrating tablet 10 mg  10 mg Oral Q8H PRN Revonda Humphrey, NP       And   LORazepam (ATIVAN) tablet 1 mg  1 mg Oral PRN Revonda Humphrey, NP       And   ziprasidone (GEODON) injection 20 mg  20 mg Intramuscular PRN Revonda Humphrey, NP       magnesium hydroxide (MILK OF MAGNESIA) suspension 30 mL  30 mL Oral Daily PRN Revonda Humphrey, NP       traZODone (DESYREL) tablet 50 mg  50 mg Oral QHS PRN Revonda Humphrey, NP   50 mg at 07/01/21 2116   PTA Medications: Medications Prior to Admission  Medication Sig Dispense Refill Last Dose   escitalopram (LEXAPRO) 10 MG tablet Take 0.5 tablets (5 mg total) by mouth daily.       Patient Stressors: Energy manager difficulties   Marital or family conflict   Occupational concerns    Patient Strengths: Ability for insight  Average or above average intelligence  Communication skills  Work skills    Treatment Modalities: Medication Management, Group therapy, Case management,  1 to 1 session with clinician, Psychoeducation, Recreational therapy.   Physician Treatment Plan for Primary Diagnosis: MDD (major depressive disorder), single episode, severe , no psychosis ([place]) Long Term Goal(s): Improvement in symptoms so as ready for discharge   Short Term Goals: Ability to identify changes in lifestyle to reduce recurrence of condition will improve Ability to verbalize feelings will improve Ability to disclose and discuss suicidal ideas Ability to demonstrate self-control will improve Ability to identify and develop effective coping behaviors will improve Ability to identify triggers associated with substance abuse/mental health issues will improve  Medication Management: Evaluate patient's response, side effects, and tolerance of medication regimen.  Therapeutic Interventions: 1 to 1 sessions, Unit Group sessions and Medication administration.  Evaluation of Outcomes: Progressing  Physician Treatment Plan for Secondary Diagnosis: Principal Problem:   MDD (major depressive disorder), single episode, severe , no psychosis ([place]) Active Problems:   Adjustment disorder with depressed mood   Generalized anxiety disorder  Long Term Goal(s): Improvement in symptoms so as ready for discharge   Short Term Goals: Ability to identify changes in lifestyle to reduce recurrence of condition will improve Ability to verbalize feelings will improve Ability to disclose and discuss suicidal ideas Ability to demonstrate self-control will improve Ability to identify and develop effective coping behaviors  will improve Ability to identify triggers associated with substance abuse/mental health issues will improve     Medication Management: Evaluate patient's response, side effects, and tolerance of medication regimen.  Therapeutic Interventions: 1 to 1 sessions, Unit Group sessions and Medication  administration.  Evaluation of Outcomes: Progressing   RN Treatment Plan for Primary Diagnosis: MDD (major depressive disorder), single episode, severe , no psychosis ([place]) Long Term Goal(s): Knowledge of disease and therapeutic regimen to maintain health will improve  Short Term Goals: Ability to verbalize feelings will improve, Ability to identify and develop effective coping behaviors will improve, and Compliance with prescribed medications will improve  Medication Management: RN will administer medications as ordered by provider, will assess and evaluate patient's response and provide education to patient for prescribed medication. RN will report any adverse and/or side effects to prescribing provider.  Therapeutic Interventions: 1 on 1 counseling sessions, Psychoeducation, Medication administration, Evaluate responses to treatment, Monitor vital signs and CBGs as ordered, Perform/monitor CIWA, COWS, AIMS and Fall Risk screenings as ordered, Perform wound care treatments as ordered.  Evaluation of Outcomes: Progressing   LCSW Treatment Plan for Primary Diagnosis: MDD (major depressive disorder), single episode, severe , no psychosis ([place]) Long Term Goal(s): Safe transition to appropriate next level of care at discharge, Engage patient in therapeutic group addressing interpersonal concerns.  Short Term Goals: Engage patient in aftercare planning with referrals and resources, Increase emotional regulation, and Facilitate acceptance of mental health diagnosis and concerns  Therapeutic Interventions: Assess for all discharge needs, 1 to 1 time with Social worker, Explore available resources and support systems, Assess for adequacy in community support network, Educate family and significant other(s) on suicide prevention, Complete Psychosocial Assessment, Interpersonal group therapy.  Evaluation of Outcomes: Progressing   Progress in Treatment: Attending groups: Yes. Participating in  groups: Yes. Taking medication as prescribed: Yes. Toleration medication: Yes. Family/Significant other contact made: No, will contact:  CSW will obtain consent Patient understands diagnosis: Yes. Discussing patient identified problems/goals with staff: Yes. Medical problems stabilized or resolved: Yes. Denies suicidal/homicidal ideation: Yes. Issues/concerns per patient self-inventory: No. Other: None  New problem(s) identified: No, Describe:  None  New Short Term/Long Term Goal(s):medication stabilization, elimination of SI thoughts, development of comprehensive mental wellness plan.   Patient Goals:  "to be happier overall"  Discharge Plan or Barriers: Patient recently admitted. CSW will continue to follow and assess for appropriate referrals and possible discharge planning.   Reason for Continuation of Hospitalization: Anxiety Depression Medication stabilization Suicidal ideation  Estimated Length of Stay: 3-5 days   Scribe for Treatment Team: Eliott Nine 07/02/2021 11:34 AM

## 2021-07-02 NOTE — Group Note (Signed)
Recreation Therapy Group Note   Group Topic:Stress Management  Group Date: 07/02/2021 Start Time: 0935 End Time: 0955 Facilitators: Caroll Rancher, LRT,CTRS Location: [address] Dayroom   Goal Area(s) Addresses:  Patient will actively participate in stress management techniques presented during session.  Patient will successfully identify benefit of practicing stress management post d/c.   Group Description: Guided Imagery. LRT provided education, instruction, and demonstration on practice of visualization via guided imagery. Patient was asked to participate in the technique introduced during session. LRT debriefed including topics of mindfulness, stress management and specific scenarios each patient could use these techniques. Patients were given suggestions of ways to access scripts post d/c and encouraged to explore Youtube and other apps available on smartphones, tablets, and computers.   Affect/Mood: Appropriate   Participation Level: Active   Participation Quality: Independent   Behavior: Appropriate   Speech/Thought Process: Focused   Insight: Good   Judgement: Good   Modes of Intervention: Script   Patient Response to Interventions:  Engaged   Education Outcome:  Acknowledges education and In group clarification offered    Clinical Observations/Individualized Feedback: Pt attended and participated in activity.    Plan: Continue to engage patient in RT group sessions 2-3x/week.   Caroll Rancher, LRT,CTRS 07/02/2021 12:14 PM

## 2021-07-02 NOTE — BHH Counselor (Signed)
CSW processed with patient about group topic around stress and anxiety.  Patient encouraged to journal and try to process issues that he wanted to process about his relationship.  Patient was thankful for ability to speak with CSW.    Jared Mcclenny, LCSW, LCAS Clincal Social Worker  [facility]

## 2021-07-03 NOTE — BHH Group Notes (Signed)
Goals Group 07/03/21    Group Focus: affirmation, clarity of thought, and goals/reality orientation Treatment Modality:  Psychoeducation Interventions utilized were assignment, group exercise, and support Purpose: To be able to understand and verbalize the reason for their admission to the hospital. To understand that the medication helps with their chemical imbalance but they also need to work on their choices in life. To be challenged to develop a list of 30 positives about themselves. Also introduce the concept that "feelings" are not reality.  Participation Level:  Active  Participation Quality:  Appropriate  Affect:  Appropriate  Cognitive:  Appropriate  Insight:  Improving  Engagement in Group:  Engaged  Additional Comments:  Pt attended the group and rates his energy at a 6/10. Pt states he is here "to be happier"  Dione Housekeeper

## 2021-07-03 NOTE — Progress Notes (Signed)
[facility] MD Progress Note  07/03/2021 4:54 PM Jared Hayes  MRN:  [id number]  Subjective: I'm feeling down today because of the people at home. I think the medicines are helping it's just that I still feeling depressed".  Daily notes 07-03-21: Jared Hayes is seen. Chart reviewed. The chart findings discussed with the treatment team. He is seen in the [address]hall office. Patient reports that this a.m. he feels that his mood is still "low" because of the people at home. Patient & another provider had discussed patient's feeling abandoned by family members & other people in his life.  Patient endorses that it is difficult for him to reach out to others and he has been nervous and confused about how to make new friends.  Patient endorses he also struggles with self-doubt when individuals do not respond to his text as rapidly as he does for others.  Patient endorses that he feels that some of the groups have been beneficial in helping him understand how to manage his anxiety and also practice his social skills.  He says his medications are helping although he feels depressed. He is encouraged to continue to take his medication & give it time to get in his system. Patient denies SI, HI and AVH, delusional thoughts or paranoia. He does not appear to be responding to any internal stimuli.  Patient reports that he does not have any negative side effects from medication. Will continue current plan of care as already in progress.  Reason for admission: Jared Hayes is a 25 yo patient w/ no PPH who presented to [facility] endorsing SI. Patient continued to endorse depressed mood and was transferred to [facility].   Principal Problem: MDD (major depressive disorder), single episode, severe , no psychosis ([place]) Diagnosis: Principal Problem:   MDD (major depressive disorder), single episode, severe , no psychosis ([place]) Active Problems:   Adjustment disorder with depressed mood   Generalized anxiety disorder  Total Time spent  with patient: 20 minutes  Past Psychiatric History: See H&P  Past Medical History: History reviewed. No pertinent past medical history. History reviewed. No pertinent surgical history. Family History:  Family History  Problem Relation Age of Onset   Diabetes Mother    Diabetes Father    Family Psychiatric  History: See H&P Social History:  Social History   Substance and Sexual Activity  Alcohol Use Yes   Comment: occ     Social History   Substance and Sexual Activity  Drug Use Not Currently   Types: Marijuana    Social History   Socioeconomic History   Marital status: Single    Spouse name: Not on file   Number of children: Not on file   Years of education: Not on file   Highest education level: Not on file  Occupational History   Not on file  Tobacco Use   Smoking status: Never    Passive exposure: Never   Smokeless tobacco: Never  Vaping Use   Vaping Use: Never used  Substance and Sexual Activity   Alcohol use: Yes    Comment: occ   Drug use: Not Currently    Types: Marijuana   Sexual activity: Not on file  Other Topics Concern   Not on file  Social History Narrative   Not on file   Social Determinants of Health   Financial Resource Strain: Not on file  Food Insecurity: Not on file  Transportation Needs: Not on file  Physical Activity: Not on file  Stress: Not on  file  Social Connections: Not on file   Additional Social History:   Sleep: Fair  Appetite:  Poor  Current Medications: Current Facility-Administered Medications  Medication Dose Route Frequency Provider Last Rate Last Admin   acetaminophen (TYLENOL) tablet 650 mg  650 mg Oral Q6H PRN Revonda Humphrey, NP   650 mg at 07/01/21 1807   alum & mag hydroxide-simeth (MAALOX/MYLANTA) 200-200-20 MG/5ML suspension 30 mL  30 mL Oral Q4H PRN Revonda Humphrey, NP       escitalopram (LEXAPRO) tablet 20 mg  20 mg Oral Daily Damita Dunnings B, MD   20 mg at 07/03/21 0753   hydrOXYzine (ATARAX)  tablet 25 mg  25 mg Oral TID PRN Revonda Humphrey, NP   25 mg at 07/03/21 1137   OLANZapine zydis (ZYPREXA) disintegrating tablet 10 mg  10 mg Oral Q8H PRN Revonda Humphrey, NP   10 mg at 07/02/21 1407   And   LORazepam (ATIVAN) tablet 1 mg  1 mg Oral PRN Revonda Humphrey, NP       And   ziprasidone (GEODON) injection 20 mg  20 mg Intramuscular PRN Revonda Humphrey, NP       magnesium hydroxide (MILK OF MAGNESIA) suspension 30 mL  30 mL Oral Daily PRN Revonda Humphrey, NP       traZODone (DESYREL) tablet 50 mg  50 mg Oral QHS PRN Revonda Humphrey, NP   50 mg at 07/01/21 2116   Lab Results:  No results found for this or any previous visit (from the past 66 hour(s)).  Blood Alcohol level:  Lab Results  Component Value Date   ETH <10 123XX123   Metabolic Disorder Labs: Lab Results  Component Value Date   HGBA1C 4.9 07/01/2021   MPG 94 07/01/2021   No results found for: PROLACTIN Lab Results  Component Value Date   CHOL 178 07/01/2021   TRIG 96 07/01/2021   HDL 32 (L) 07/01/2021   CHOLHDL 5.6 07/01/2021   VLDL 19 07/01/2021   LDLCALC 127 (H) 07/01/2021   Physical Findings: AIMS: Facial and Oral Movements Muscles of Facial Expression: None, normal Lips and Perioral Area: None, normal Jaw: None, normal Tongue: None, normal,Extremity Movements Upper (arms, wrists, hands, fingers): None, normal Lower (legs, knees, ankles, toes): None, normal, Trunk Movements Neck, shoulders, hips: None, normal, Overall Severity Severity of abnormal movements (highest score from questions above): None, normal Incapacitation due to abnormal movements: None, normal Patient's awareness of abnormal movements (rate only patient's report): No Awareness, Dental Status Current problems with teeth and/or dentures?: No Does patient usually wear dentures?: No  CIWA:    COWS:     Musculoskeletal: Strength & Muscle Tone: within normal limits Gait & Station: normal Patient leans:  N/A  Psychiatric Specialty Exam:  Presentation  General Appearance: Casual; Fairly Groomed  Eye Contact:Fair  Speech:Clear and Coherent; Slow  Speech Volume:Decreased  Handedness:Right   Mood and Affect  Mood:Depressed  Affect:Congruent; Flat  Thought Process  Thought Processes:Coherent  Descriptions of Associations:Intact  Orientation:Full (Time, Place and Person)  Thought Content:Logical  History of Schizophrenia/Schizoaffective disorder:No  Duration of Psychotic Symptoms: NA Hallucinations:Hallucinations: None  Ideas of Reference:None  Suicidal Thoughts:Suicidal Thoughts: No SI Active Intent and/or Plan: Without Intent; Without Plan; Without Means to Carry Out; Without Access to Means SI Passive Intent and/or Plan: Without Intent; Without Plan; Without Means to Carry Out; Without Access to Means  Homicidal Thoughts:Homicidal Thoughts: No  Sensorium  Memory:Immediate Good; Recent Good;  Remote Good  Judgment:Fair  Insight:Fair  Executive Functions  Concentration:Fair  Attention Span:Fair  [place]  Language:Good  Psychomotor Activity  Psychomotor Activity:Psychomotor Activity: Decreased  Assets  Assets:Communication Skills; Desire for Improvement; Physical Health; Resilience; Social Support  Sleep  Sleep:Number of Hours of Sleep: 6   Physical Exam: Physical Exam Constitutional:      Appearance: Normal appearance.  HENT:     Head: Normocephalic and atraumatic.  Pulmonary:     Effort: Pulmonary effort is normal.  Neurological:     Mental Status: He is alert and oriented to person, place, and time.   Review of Systems  Psychiatric/Behavioral:  Positive for depression. Negative for hallucinations and suicidal ideas. The patient is nervous/anxious.   Blood pressure (!) (P) 115/92, pulse (P) 68, temperature (!) (P) 97.5 F (36.4 C), temperature source (P) Oral, resp. rate (P) 16, height 6' (1.829 m), weight  117.9 kg, SpO2 98 %. Body mass index is 35.26 kg/m.  Treatment Plan Summary: Daily contact with patient to assess and evaluate symptoms and progress in treatment and Medication management.  Jared Hayes is a 26 yo w/ no previous [place] who appears to meet criteria for MDD, recurrent severe and has comorbid anxiety.  Patient continues to overall appear with a depressed affect and dysphoric mood.   MDD, single episode, current. Severe w/o psychosis GAD -Continue Lexapro 20 mg po daily.   Encourage patient to participate in psychotherapy and interact in the mileu.   PRN -Tylenol 650mg  q6h, pain -Maalox 19ml q4h, indigestion -Atarax 25mg  TID, anxiety -Milk of Mag 68mL, constipation -Trazodone 50mg  QHS, insomnia   Agitation Protocol:  -Zyprexa 10mg  PO q8h, Ativan 1mg , Geodon 20mg  IM   Physician Treatment Plan for Primary Diagnosis: Adjustment disorder with mixed anxiety and depressed mood  Long Term Goal(s): Improvement in symptoms so as ready for discharge   Short Term Goals: Ability to identify changes in lifestyle to reduce recurrence of condition will improve, Ability to verbalize feelings will improve, Ability to disclose and discuss suicidal ideas, Ability to demonstrate self-control will improve, Ability to identify and develop effective coping behaviors will improve, and Ability to identify triggers associated with substance abuse/mental health issues will improve   Physician Treatment Plan for Secondary Diagnosis: Principal Problem:  Lindell Spar, NP, pmhnp, fnp-bc 07/03/2021, 4:54 PM Patient ID: Eyvonne Left, male   DOB: 05-Mar-1996, 26 y.o.   MRN: [id number]

## 2021-07-03 NOTE — Group Note (Signed)
LCSW Group Therapy Note  Group Date: 07/03/2021 Start Time: 1030 End Time: 1130   Type of Therapy and Topic:  Group Therapy - Healthy vs Unhealthy Coping Skills  Participation Level:  Minimal   Description of Group The focus of this group was to determine what unhealthy coping techniques typically are used by group members and what healthy coping techniques would be helpful in coping with various problems. Patients were guided in becoming aware of the differences between healthy and unhealthy coping techniques. Patients were asked to identify 2-3 healthy coping skills they would like to learn to use more effectively.  Therapeutic Goals Patients learned that coping is what human beings do all day long to deal with various situations in their lives Patients defined and discussed healthy vs unhealthy coping techniques Patients identified their preferred coping techniques and identified whether these were healthy or unhealthy Patients determined 2-3 healthy coping skills they would like to become more familiar with and use more often. Patients provided support and ideas to each other   Summary of Patient Progress:  During group, Jared Hayes expressed an unhealthy coping skill he uses is holding things in, while a healthy coping skill he uses is positive thinking and replacement thoughts. Patient was silent for the remainder of group, but did listen attentively. Patient demonstrated some insight into the subject matter, was respectful of peers.   Therapeutic Modalities Cognitive Behavioral Therapy Motivational Interviewing  Marquette Old 07/03/2021  2:07 PM

## 2021-07-03 NOTE — Progress Notes (Signed)
[place] Group Notes:  (Nursing/MHT/Case Management/Adjunct)  Date:  07/03/2021  Time:  2015  Type of Therapy:   wrap up group  Participation Level:  Active  Participation Quality:  Appropriate, Attentive, Sharing, and Supportive  Affect:  Depressed  Cognitive:  Alert  Insight:  Improving  Engagement in Group:  Engaged  Modes of Intervention:  Clarification, Education, and Support  Summary of Progress/Problems: Positive thinking and positive change were discussed. Pt shared that he laughed today and reports plans to work and focus on himself.   Shellia Cleverly 07/03/2021, 9:19 PM

## 2021-07-03 NOTE — Progress Notes (Signed)
Patient denies SI, HI and AVH this shift. Patient has been compliant with medications and attended groups.  Patient has had no incidents of dyscontrol this shift.  ° °Assess patient for safety, offer medications as prescribed, engage patient in 1:1 staff talks.  ° °Patient able to contract for safety. Continue to monitor as planned.  °

## 2021-07-03 NOTE — BHH Group Notes (Signed)
.  Psychoeducational Group Note    Date:07/03/21 Time: 1300-1400    Purpose of Group: . The group focus' on teaching patients on how to identify their needs and their Life Skills:  A group where two lists are made. What people need and what are things that we do that are unhealthy. The lists are developed by the patients and it is explained that we often do the actions that are not healthy to get our list of needs met.  Goal:: to develop the coping skills needed to get their needs met  Participation Level:  Active  Participation Quality:  Appropriate  Affect:  Appropriate  Cognitive:  Oriented  Insight:  Improving  Engagement in Group:  Engaged  Additional Comments:  Pt rates his energy at a 1/10. In the beginning of the group Pt sat with is forhead propped up against the wall, and his eyes closed. During the group, pt turned his chair around and begian to participate in the group. Had good questions and provided good insight to the group.  Jared Hayes

## 2021-07-04 NOTE — Progress Notes (Signed)
[facility] Jared Hayes Progress Note  07/04/2021 3:10 PM Jared Hayes  MRN:  [id number]  Subjective: Jared Hayes reports, "I'm doing good today. The only thing is, when I woke up this morning, I felt like I could not focus. It lasted for about an hour. I'm doing better now". Daily notes 07-04-21: Jared Hayes is seen. Chart reviewed. The chart findings discussed with the treatment team. He is seen in the [address] office. Patient reports that upon waking up this morning, he felt like he could not focus. However, this lasted for about an hour he expressed. He says he has been feeling a lot better since that time. Jared Hayes is visible on the unit, attending group sessions. He is taking & tolerating his treatment regimen. He denies any side effects. He thinks that both his medications & group sessions are helpful to him. He says he hopes to be linked to an outpatient therapist & psychiatrist to follow-up with after discharge. Patient denies SI, HI, AVH, delusional thoughts or paranoia. He does not appear to be responding to any internal stimuli.  Patient seems to be doing/feeling a lot better today than yesterday. His affect & eye contact are improving as well.  Will continue current plan of care as already in progress.  Reason for admission: Jared Hayes is a 26 yo patient w/ no PPH who presented to [facility] endorsing SI. Patient continued to endorse depressed mood and was transferred to [facility].   Principal Problem: MDD (major depressive disorder), single episode, severe , no psychosis ([place]) Diagnosis: Principal Problem:   MDD (major depressive disorder), single episode, severe , no psychosis ([place]) Active Problems:   Adjustment disorder with depressed mood   Generalized anxiety disorder  Total Time spent with patient: 20 minutes  Past Psychiatric History: See H&P  Past Medical History: History reviewed. No pertinent past medical history. History reviewed. No pertinent surgical history. Family History:  Family History   Problem Relation Age of Onset   Diabetes Mother    Diabetes Father    Family Psychiatric  History: See H&P  Social History:  Social History   Substance and Sexual Activity  Alcohol Use Yes   Comment: occ     Social History   Substance and Sexual Activity  Drug Use Not Currently   Types: Marijuana    Social History   Socioeconomic History   Marital status: Single    Spouse name: Not on file   Number of children: Not on file   Years of education: Not on file   Highest education level: Not on file  Occupational History   Not on file  Tobacco Use   Smoking status: Never    Passive exposure: Never   Smokeless tobacco: Never  Vaping Use   Vaping Use: Never used  Substance and Sexual Activity   Alcohol use: Yes    Comment: occ   Drug use: Not Currently    Types: Marijuana   Sexual activity: Not on file  Other Topics Concern   Not on file  Social History Narrative   Not on file   Social Determinants of Health   Financial Resource Strain: Not on file  Food Insecurity: Not on file  Transportation Needs: Not on file  Physical Activity: Not on file  Stress: Not on file  Social Connections: Not on file   Additional Social History:   Sleep: Fair  Appetite:  Good  Current Medications: Current Facility-Administered Medications  Medication Dose Route Frequency Provider Last Rate Last Admin   acetaminophen (  TYLENOL) tablet 650 mg  650 mg Oral Q6H PRN Jared Humphrey, Jared Hayes   650 mg at 07/01/21 1807   alum & mag hydroxide-simeth (MAALOX/MYLANTA) 200-200-20 MG/5ML suspension 30 mL  30 mL Oral Q4H PRN Jared Humphrey, Jared Hayes       escitalopram (LEXAPRO) tablet 20 mg  20 mg Oral Daily Jared Hayes Hayes, Jared Hayes   20 mg at 07/04/21 0750   hydrOXYzine (ATARAX) tablet 25 mg  25 mg Oral TID PRN Jared Humphrey, Jared Hayes   25 mg at 07/04/21 1442   OLANZapine zydis (ZYPREXA) disintegrating tablet 10 mg  10 mg Oral Q8H PRN Jared Humphrey, Jared Hayes   10 mg at 07/02/21 1407   And    LORazepam (ATIVAN) tablet 1 mg  1 mg Oral PRN Jared Humphrey, Jared Hayes       And   ziprasidone (GEODON) injection 20 mg  20 mg Intramuscular PRN Jared Humphrey, Jared Hayes       magnesium hydroxide (MILK OF MAGNESIA) suspension 30 mL  30 mL Oral Daily PRN Jared Humphrey, Jared Hayes       traZODone (DESYREL) tablet 50 mg  50 mg Oral QHS PRN Jared Humphrey, Jared Hayes   50 mg at 07/03/21 2058   Lab Results:  No results found for this or any previous visit (from the past 48 hour(s)).  Blood Alcohol level:  Lab Results  Component Value Date   ETH <10 123XX123   Metabolic Disorder Labs: Lab Results  Component Value Date   HGBA1C 4.9 07/01/2021   MPG 94 07/01/2021   No results found for: PROLACTIN Lab Results  Component Value Date   CHOL 178 07/01/2021   TRIG 96 07/01/2021   HDL 32 (L) 07/01/2021   CHOLHDL 5.6 07/01/2021   VLDL 19 07/01/2021   LDLCALC 127 (H) 07/01/2021   Physical Findings: AIMS: Facial and Oral Movements Muscles of Facial Expression: None, normal Lips and Perioral Area: None, normal Jaw: None, normal Tongue: None, normal,Extremity Movements Upper (arms, wrists, hands, fingers): None, normal Lower (legs, knees, ankles, toes): None, normal, Trunk Movements Neck, shoulders, hips: None, normal, Overall Severity Severity of abnormal movements (highest score from questions above): None, normal Incapacitation due to abnormal movements: None, normal Patient's awareness of abnormal movements (rate only patient's report): No Awareness, Dental Status Current problems with teeth and/or dentures?: No Does patient usually wear dentures?: No  CIWA:    COWS:     Musculoskeletal: Strength & Muscle Tone: within normal limits Gait & Station: normal Patient leans: N/A  Psychiatric Specialty Exam:  Presentation  General Appearance: Casual; Fairly Groomed  Eye Contact:Fair  Speech:Clear and Coherent; Slow  Speech Volume:Decreased  Handedness:Right   Mood and Affect   Mood:Depressed  Affect:Congruent; Flat  Thought Process  Thought Processes:Coherent  Descriptions of Associations:Intact  Orientation:Full (Time, Place and Person)  Thought Content:Logical  History of Schizophrenia/Schizoaffective disorder:No  Duration of Psychotic Symptoms: NA Hallucinations:Hallucinations: None  Ideas of Reference:None  Suicidal Thoughts:Suicidal Thoughts: No SI Active Intent and/or Plan: Without Intent; Without Plan; Without Means to Carry Out; Without Access to Means SI Passive Intent and/or Plan: Without Intent; Without Plan; Without Means to Carry Out; Without Access to Means  Homicidal Thoughts:Homicidal Thoughts: No  Sensorium  Memory:Immediate Good; Recent Good; Remote Good  Judgment:Fair  Insight:Fair  Executive Functions  Concentration:Fair  Attention Span:Fair  [place]  Language:Good  Psychomotor Activity  Psychomotor Activity:Psychomotor Activity: Decreased  Assets  Assets:Communication Skills; Desire for Improvement; Physical  Health; Resilience; Social Support  Sleep  Sleep:Number of Hours of Sleep: 6  Physical Exam: Physical Exam Constitutional:      Appearance: Normal appearance.  HENT:     Head: Normocephalic and atraumatic.     Nose: Nose normal.     Mouth/Throat:     Pharynx: Oropharynx is clear.  Eyes:     Pupils: Pupils are equal, round, and reactive to light.  Cardiovascular:     Rate and Rhythm: Normal rate.  Pulmonary:     Effort: Pulmonary effort is normal.  Genitourinary:    Comments: Deferred Musculoskeletal:        General: Normal range of motion.     Cervical back: Normal range of motion.  Skin:    General: Skin is warm.  Neurological:     General: No focal deficit present.     Mental Status: He is alert and oriented to person, place, and time.   Review of Systems  Constitutional:  Negative for chills, diaphoresis and fever.  HENT:  Negative for congestion and  sore throat.   Eyes:  Negative for blurred vision.  Respiratory:  Negative for cough, shortness of breath and wheezing.   Cardiovascular:  Negative for chest pain and palpitations.  Gastrointestinal:  Negative for abdominal pain, constipation, diarrhea, heartburn, nausea and vomiting.  Genitourinary:  Negative for dysuria.  Musculoskeletal:  Negative for joint pain and myalgias.  Skin: Negative.   Neurological:  Negative for dizziness, tingling, tremors, sensory change, speech change, focal weakness, seizures, loss of consciousness, weakness and headaches.  Endo/Heme/Allergies:        Allergies: NKDA  Psychiatric/Behavioral:  Positive for depression. Negative for hallucinations, memory loss, substance abuse and suicidal ideas. The patient is nervous/anxious. The patient does not have insomnia.   Blood pressure 112/63, pulse 83, temperature 98.1 F (36.7 C), temperature source Oral, resp. rate (P) 16, height 6' (1.829 m), weight 117.9 kg, SpO2 98 %. Body mass index is 35.26 kg/m.  Treatment Plan Summary: Daily contact with patient to assess and evaluate symptoms and progress in treatment and Medication management.   Continue inpatient hospitalization.  Will continue today 07/04/2021 plan as below except where it is noted.   Ival Phegley is a 26 yo w/ no previous [place] who appears to meet criteria for MDD, recurrent severe and has comorbid anxiety.  Patient continues to overall appear with a depressed affect and dysphoric mood.   MDD, single episode, current. Severe w/o psychosis GAD -Continue Lexapro 20 mg po daily.   Encourage patient to participate in psychotherapy and interact in the mileu.   PRN -Tylenol 650mg  q6h, pain -Maalox 49ml q4h, indigestion -Atarax 25mg  TID, anxiety -Milk of Mag 4mL, constipation -Trazodone 50mg  QHS, insomnia   Agitation Protocol:  -Zyprexa 10mg  PO q8h, Ativan 1mg , Geodon 20mg  IM.  -Encourage group participation.  -Discharge disposition plan  is ongoing.  Jared Spar, Jared Hayes, pmhnp, fnp-bc 07/04/2021, 3:10 PM Patient ID: Jared Hayes, male   DOB: 1995/12/23, 26 y.o.   MRN: [id number] Patient ID: Jared Hayes, male   DOB: 1995/07/25, 26 y.o.   MRN: [id number]

## 2021-07-04 NOTE — Progress Notes (Signed)
°   07/04/21 0600  Sleep  Number of Hours 7.5

## 2021-07-04 NOTE — [facility] Group Notes (Signed)
Adult Psychoeducational Group Not Date:  07/04/2021 Time:  8101-7510 Group Topic/Focus: PROGRESSIVE RELAXATION. A group where deep breathing is taught and tensing and relaxation muscle groups is used. Imagery is used as well.  Pts are asked to imagine 3 pillars that hold them up when they are not able to hold themselves up.  Participation Level:  Active  Participation Quality:  Appropriate  Affect:  Appropriate  Cognitive:  Oriented  Insight: Improving  Engagement in Group:  Engaged  Modes of Intervention:  Activity, Discussion, Education, and Support  Additional Comments:  Rates his energy at an 8/10. Pt states what holds him up is his family. His goals and his dreams  Dione Housekeeper

## 2021-07-04 NOTE — Progress Notes (Signed)
Patient rates his day as a 8 out of 10 since he is in a better mood. He feels that his med's are working since he is feeling less depressed. Jared Hayes His goal for tomorrow is to work on his anxiety.

## 2021-07-04 NOTE — Progress Notes (Addendum)
D:  Patient denied SI and HI, contracts for safety.  Denied A/V hallucinations.  Denied pain. A:  Medications administered per MD orders.  Emotional support and encouragement given patient. R:  Safety maintained with 15 minute checks.  Patient stated he feels "scatter brained" this morning.  Decreased concentration. Patient stated he slept 9 hours last night.  Self inventory sheet, patient sleeps good, sleep medication helpful.  Good appetite, normal energy level, good concentration.  Rated depression 3, denied hopeless, anxiety 5.  Denied withdrawals.  Denied SI.  Denied physical problems.  Denied physical pain.  Goal is to accept myself.  Plans to stay positive.  Does have discharge plan.

## 2021-07-04 NOTE — Plan of Care (Signed)
Nurse discussed anxiety, depression and coping skills. 

## 2021-07-04 NOTE — [facility] Counselor (Signed)
Adult Comprehensive Assessment  Patient ID: Jared Hayes, male   DOB: [birth year], 26 y.o.   MRN: [id number]  Information Source: Information source: Patient (Information gathered on 07/03/2021 - Late entry)  Current Stressors:  Patient states their primary concerns and needs for treatment are:: Help for mental health Patient states their goals for this hospitilization and ongoing recovery are:: Find a way to be happier.  Stop blaming others for stuff that happens. Educational / Learning stressors: Is in college on-line fulltime.  Has to go straight from work to do school work.  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 Employment / Job issues: Would like to switch to 2nd shift if possible.  He now works 11:30pm to 7:30am.  His therapist is writing a letter asking for him to be switched. Family Relationships: He feels judged or dismissed by his family, cannot go to them about his mental health. Financial / Lack of resources (include bankruptcy): Therapist is $100 per visit, so he can only see her once a month but feels he needs to see her weekly.  His insurance requires him to pay out $1500 before it starts covering anything.  His car payment and insurance alone are $1,000 monthly. Housing / Lack of housing: Would rather live somewhere else.  Even though lives with 2 grandparents and 2 parents, is lonely. Physical health (include injuries & life threatening diseases): Denies stressors. Social relationships: This is very stressful.  He has been dating a girl he met through a dating app for about 3-4 months, was happier than he has ever been.  She broke up with him about a week ago, helped him to get into this hospital, then told him earlier in the day 1/28 that she wants "nothing more to do with me." Substance abuse: Denies stressors.  Denies use. Bereavement / Loss: Break up with girlfriend recently.  Childhood dog had to  be put down.  Another dog died in the last month unexpectedly.  Living/Environment/Situation:  Living Arrangements: Parent, Other relatives Who else lives in the home?: Adoptive mother and father, paternal grandparents How long has patient lived in current situation?: 1 year What is atmosphere in current home: Loving, Other (Comment) (Lonely)  Family History:  Marital status: Single Does patient have children?: No  Childhood History:  By whom was/is the patient raised?: Both parents, Grandparents Additional childhood history information: Adopted at age 49-4yo, does not know about his birth parents. Description of patient's relationship with caregiver when they were a child: Good with both, although they would joke around and call him "stupid." Patient's description of current relationship with people who raised him/her: Father - "okay"; Mother - dismissive of his mental health concerns. How were you disciplined when you got in trouble as a child/adolescent?: Grounded or spanked Does patient have siblings?: Yes Number of Siblings: 2 Description of patient's current relationship with siblings: Sister - no contact because of choices she has made; Brother - talks to him about once a month Did patient suffer any verbal/emotional/physical/sexual abuse as a child?:  (Does not know about anything that happened prior to age 25-4yo when he was adopted.  Does not know why he was put up for adoption.) Did patient suffer from severe childhood neglect?: No Has patient ever been sexually abused/assaulted/raped as an adolescent or adult?: No Was the patient ever a victim of a crime or a disaster?: No Witnessed domestic violence?: No Has patient been affected  by domestic violence as an adult?: No  Education:  Highest grade of school patient has completed: Some college Currently a student?: Yes Name of school: [employer] Learning disability?: No  Employment/Work Situation:   Employment  Situation: Employed Where is Patient Currently Employed?: [employer] How Long has Patient Been Employed?: 5-6 months Are You Satisfied With Your Job?: No (Sees opportunity) Do You Work More Than One Job?: Yes (Is also in school full-time) Work Stressors: Pt states he attends school full-time while working full-time Patient's Job has Been Impacted by Current Illness: Yes Describe how Patient's Job has Been Impacted: Pt has missed 3 days of work this week due to anxiety What is the Longest Time Patient has Held a Job?: 1-1/2 years 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 Resources:   Financial resources: Income from employment, Private insurance Does patient have a representative payee or guardian?: No  Alcohol/Substance Abuse:   What has been your use of drugs/alcohol within the last 12 months?: Denies use Alcohol/Substance Abuse Treatment Hx: Denies past history Has alcohol/substance abuse ever caused legal problems?: No  Social Support System:   Pensions consultant Support System: Poor Describe Community Support System: Mother - she does not understand his mental health issues and minimizes them Type of faith/religion: Darrick Meigs How does patient's faith help to cope with current illness?: Has not practiced in 8-9 years  Leisure/Recreation:   Do You Have Hobbies?: No  Strengths/Needs:   What is the patient's perception of their strengths?: Understanding, non-judgmental, accepting, supportive Patient states they can use these personal strengths during their treatment to contribute to their recovery: Try to extend these things to herself. Patient states these barriers may affect/interfere with their treatment: N/A Patient states these barriers may affect their return to the community: N/A Other important information patient would like considered in planning for their treatment: N/A  Discharge Plan:   Currently receiving community mental health services: Yes  (From Whom) (Sees therapist Coolidge Breeze at [employer] [employer] - it costs $100 per visit so he cannot go more often but would like to) Patient states concerns and preferences for aftercare planning are: Needs psychiatry referral.  Would like to see a therapist weekly (already has one, but it costs a lot because he is required to pay $1500 out of pocket with his insurance before they will kick in).  VERY interested in support groups. Patient states they will know when they are safe and ready for discharge when: When he does not have thoughts of hurting himself Does patient have access to transportation?: Yes Does patient have financial barriers related to discharge medications?: Yes Patient description of barriers related to discharge medications: It will depend on the cost -- he has insurance but it covers little. Will patient be returning to same living situation after discharge?: Yes  Summary/Recommendations:   Summary and Recommendations (to be completed by the evaluator): Patient is a 26yo male hospitalized with thoughts of suicide, worsening depression and anxiety.  Primary stressors include a recent girlfriend, his most serious to date, ending the relationship, needing to euthanize his childhood dog, and the death of another dog.  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  He sees an on-line therapist at [facility] which is beneficial but can only see her monthly because it is $100 per visit until his insurance kicks in after he has spent $  1500.  He is interested in a referral to psychiatry, therapy if something on a sliding scale can be found, and support groups.  He lives with adoptive parents and grandparents, does not feel they are supportive of his mental health.  Patient would benefit from crisis stabilization, milieu participation, medication management, group therapy,  psychoeducation, and discharge planning.  At discharge it is recommended that he adhere to the established follow-up plan.  Maretta Los. 07/04/2021

## 2021-07-04 NOTE — Group Note (Signed)
[facility] LCSW Group Therapy Note  07/04/2021  10:00-11:00AM  Type of Therapy and Topic:  Group Therapy:  Building Supports  Participation Level:  Minimal   Description of Group:  Patients in this group were introduced to the idea of adding a variety of healthy supports to address the various needs in their lives.  Different types of support were defined and described, and patients were asked to act out what each type could be.  Patients discussed what additional healthy supports could be helpful in their recovery and wellness after discharge in order to prevent future hospitalizations.   An emphasis was placed on following up with the discharge plan when they leave the hospital in order to continue becoming healthier and happier.  Two songs were played during group to help further patients' understanding.  Therapeutic Goals: 1)  demonstrate the importance of adding supports  2)  discuss 4 definitions of support  3)  identify the patient's current level of healthy support and   4)  elicit commitments to add one healthy support   Summary of Patient Progress:  The patient expressed some comprehension of the concepts presented, and appeared to agree that there is a need to add more supports.  The patient indicated one healthy support that could be helpful if added would be companionship and group therapy.    Therapeutic Modalities:   Motivational Interviewing Brief Solution-Focused Therapy  Lynnell Chad

## 2021-07-05 MED ORDER — ESCITALOPRAM OXALATE 20 MG PO TABS: 20.0000 mg | ORAL_TABLET | Freq: Every day | ORAL | 0 refills | Status: AC

## 2021-07-05 MED ORDER — TRAZODONE HCL 50 MG PO TABS: 50.0000 mg | ORAL_TABLET | Freq: Every evening | ORAL | 0 refills | Status: AC | PRN

## 2021-07-05 MED ORDER — HYDROXYZINE HCL 25 MG PO TABS: 25.0000 mg | ORAL_TABLET | Freq: Three times a day (TID) | ORAL | 0 refills | Status: AC | PRN

## 2021-07-05 NOTE — Progress Notes (Signed)
D- Patient alert and oriented x4. Patient in stable mood, however states he feels very anxious. Pt states he has been anxious all day but would not state why. Pt requested anxiety meds but time for PRN anti-anxiety meds was not due. Denies SI, HI, AVH. Pt interacting positively with staff and peers.    A- Scheduled medications administered to patient, per MD orders.Routine safety checks conducted every 15 minutes.  Patient informed to notify staff with problems or concerns.   R- Patient compliant with medications and verbalized understanding treatment plan.    07/04/21 2100  Psych Admission Type (Psych Patients Only)  Admission Status Voluntary  Psychosocial Assessment  Patient Complaints Anxiety  Eye Contact Fair  Facial Expression Anxious  Affect Appropriate to circumstance;Sad  Speech Logical/coherent  Interaction Assertive  Motor Activity Other (Comment) (WNL)  Appearance/Hygiene Unremarkable  Behavior Characteristics Appropriate to situation;Cooperative;Anxious  Mood Anxious  Thought Process  Coherency Concrete thinking  Content Blaming self  Delusions None reported or observed  Perception WDL  Hallucination None reported or observed  Judgment Poor  Confusion None  Danger to Self  Current suicidal ideation? Denies  Self-Injurious Behavior No self-injurious ideation or behavior indicators observed or expressed   Agreement Not to Harm Self Yes  Danger to Others  Danger to Others None reported or observed

## 2021-07-05 NOTE — BHH Suicide Risk Assessment (Signed)
[facility] Discharge Suicide Risk Assessment   Principal Problem: MDD (major depressive disorder), single episode, severe , no psychosis ([place]) Discharge Diagnoses: Principal Problem:   MDD (major depressive disorder), single episode, severe , no psychosis ([place]) Active Problems:   Adjustment disorder with depressed mood   Generalized anxiety disorder   Total Time spent with patient: 15 minutes  Musculoskeletal: Strength & Muscle Tone: within normal limits Gait & Station: normal Patient leans: N/A  Psychiatric Specialty Exam  Presentation  General Appearance: Casual; Fairly Groomed  Eye Contact:Fair  Speech:Clear and Coherent; Slow  Speech Volume:Decreased  Handedness:Right   Mood and Affect  Mood:Depressed  Duration of Depression Symptoms: Greater than two weeks  Affect:Congruent; Flat   Thought Process  Thought Processes:Coherent  Descriptions of Associations:Intact  Orientation:Full (Time, Place and Person)  Thought Content:Logical  History of Schizophrenia/Schizoaffective disorder:No  Duration of Psychotic Symptoms:No data recorded Hallucinations:No data recorded Ideas of Reference:None  Suicidal Thoughts:No data recorded Homicidal Thoughts:No data recorded  Sensorium  Memory:Immediate Good; Recent Good; Remote Good  Judgment:Fair  Insight:Fair   Executive Functions  Concentration:Fair  Attention Span:Fair  [place]  Language:Good   Psychomotor Activity  Psychomotor Activity:No data recorded  Assets  Assets:Communication Skills; Desire for Improvement; Physical Health; Resilience; Social Support   Sleep  Sleep:No data recorded  Physical Exam: Physical Exam Vitals and nursing note reviewed.  Constitutional:      Appearance: Normal appearance.  HENT:     Head: Normocephalic.  Eyes:     Extraocular Movements: Extraocular movements intact.  Cardiovascular:     Rate and Rhythm: Normal rate.  Pulmonary:      Effort: Pulmonary effort is normal.  Musculoskeletal:        General: Normal range of motion.     Cervical back: Normal range of motion.  Neurological:     General: No focal deficit present.     Mental Status: He is alert and oriented to person, place, and time.  Psychiatric:        Attention and Perception: Attention and perception normal.        Mood and Affect: Mood and affect normal.        Speech: Speech normal.        Behavior: Behavior normal. Behavior is cooperative.        Thought Content: Thought content normal.        Cognition and Memory: Cognition and memory normal.        Judgment: Judgment normal.   Review of Systems  Constitutional:  Negative for chills and fever.  HENT:  Negative for hearing loss.   Eyes:  Negative for blurred vision.  Respiratory:  Negative for cough.   Cardiovascular:  Negative for chest pain.  Gastrointestinal:  Negative for nausea and vomiting.  Musculoskeletal:  Negative for back pain and myalgias.  Skin:  Negative for rash.  Neurological:  Negative for dizziness and headaches.  Psychiatric/Behavioral:  Negative for hallucinations, substance abuse and suicidal ideas. The patient does not have insomnia.   Blood pressure 117/70, pulse 90, temperature 97.6 F (36.4 C), resp. rate 18, height 6' (1.829 m), weight 117.9 kg, SpO2 98 %. Body mass index is 35.26 kg/m.  Mental Status Per Nursing Assessment::   On Admission:  Suicidal ideation indicated by patient  Demographic Factors:  Male, Adolescent or young adult, and Caucasian  Loss Factors: Loss of significant relationship  Historical Factors: Impulsivity  Risk Reduction Factors:   Sense of responsibility to family and Living  with another person, especially a relative  Continued Clinical Symptoms:  Dysthymia  Cognitive Features That Contribute To Risk:  None    Suicide Risk:  Minimal: No identifiable suicidal ideation.  Patients presenting with no risk factors but with  morbid ruminations; may be classified as minimal risk based on the severity of the depressive symptoms   Follow-up [facility]. Go on 07/22/2021.   Specialty: Behavioral Health Why: You have an appointment for therapy services on 07/22/21 at 11:00 am.   The first appointment will be held in person. Contact information: [address] [place], [place]. Go on 07/28/2021.   Why: You have an appointment for medication management services on 07/28/21 at 3:00 pm.    This appointment will be held in person (you may call to switch to Virtual) Contact information: [place] [address] [phone]         NAMI groups. Go to.   Why: Go to Website and learn about support groups and educational groups offered locally and virtually. Contact information: [email or web address]                Plan Of Care/Follow-up recommendations:  Activity:  ad lib Diet:  regular Other:    Prescriptions for new medications provided for the patient to bridge to follow up appointment. The patient was informed that refills for these prescriptions are generally not provided, and patient is encouraged to attend all follow up appointments to address medication refills and adjustments.   Today's discharge was reviewed with treatment team, and the team is in agreement that the patient is ready for discharge. The patient is was of the discharge plan for today and has been given opportunity to ask questions. At time of discharge, the patient does not vocalize any acute harm to self or others, is goal directed, able to advocate for self and organizational baseline.   At discharge, the patient is instructed to:  Take all medications as prescribed. Report any adverse effects and or reactions from the medicines to her outpatient provider promptly.  Do not  engage in alcohol and/or illegal drug use while on prescription medicines.  In the event of worsening symptoms, patient is instructed to call the crisis hotline, 911 and or go to the nearest ED for appropriate evaluation and treatment of symptoms.  Follow-up with primary care provider for further care of medical issues, concerns and or health care needs.    Maida Sale, MD 07/05/2021, 12:06 PM

## 2021-07-05 NOTE — Group Note (Signed)
Date:  07/05/2021 Time:  11:15 AM  Group Topic/Focus:  Orientation:   The focus of this group is to educate the patient on the purpose and policies of crisis stabilization and provide a format to answer questions about their admission.  The group details unit policies and expectations of patients while admitted.    Participation Level:  Active  Participation Quality:  Appropriate  Affect:  Appropriate  Cognitive:  Appropriate  Insight: Appropriate  Engagement in Group:  Engaged  Modes of Intervention:  Discussion  Additional Comments:  Pt wants to get discharged and remain positive  Garvin Fila 07/05/2021, 11:15 AM

## 2021-07-05 NOTE — Progress Notes (Signed)
°  [facility] Adult Case Management Discharge Plan :  Will you be returning to the same living situation after discharge:  Yes,  living with parents At discharge, do you have transportation home?: Yes,  girlfriend will pick patient up Do you have the ability to pay for your medications: Yes,  insurance  Release of information consent forms completed and in the chart;  Patient's signature needed at discharge.  Patient to Follow up at:  Follow-up Information     [facility] Go on 07/22/2021.   Specialty: Behavioral Health Why: You have an appointment for therapy services on 07/22/21 at 11:00 am.   The first appointment will be held in person. Contact information: [address] [place] [address] [phone]        [facility] Go on 07/28/2021.   Why: You have an appointment for medication management services on 07/28/21 at 3:00 pm.    This appointment will be held in person (you may call to switch to Virtual) Contact information: [address] [place] [address] [phone]                 Next level of care provider has access to [facility] Link:yes  Safety Planning and Suicide Prevention discussed: Yes,  mother, Nakul Avino     Has patient been referred to the Quitline?: N/A patient is not a smoker  Patient has been referred for addiction treatment: N/A  Gardiner Sleeper Ayshia Gramlich, LCSW 07/05/2021, 9:44 AM

## 2021-07-05 NOTE — Group Note (Signed)
Date:  07/05/2021 Time:  10:30 AM  Group Topic/Focus:  Goals Group:   The focus of this group is to help patients establish daily goals to achieve during treatment and discuss how the patient can incorporate goal setting into their daily lives to aide in recovery. Orientation:   The focus of this group is to educate the patient on the purpose and policies of crisis stabilization and provide a format to answer questions about their admission.  The group details unit policies and expectations of patients while admitted.    Participation Level:  Active  Participation Quality:  Appropriate  Affect:  Appropriate  Cognitive:  Appropriate  Insight: Appropriate  Engagement in Group:  Engaged  Modes of Intervention:  Discussion  Additional Comments:  Pt wants to work on Discharge plan  Jaquita Rector 07/05/2021, 10:30 AM

## 2021-07-05 NOTE — Progress Notes (Signed)
Pt completed survey. RN met with pt and reviewed pt's discharge instructions. Pt verbalized understanding of discharge instructions and pt did not have any questions. RN reviewed and provided pt with a copy of SRA, AVS and Transition Record. RN returned pt's belongings to pt. Pt denied SI/HI/AVH and voiced no concerns. Pt was appreciative of the care pt received at [facility]. Patient discharged to the lobby without incident.   07/05/21 0758  Psych Admission Type (Psych Patients Only)  Admission Status Voluntary  Psychosocial Assessment  Patient Complaints Anxiety  Eye Contact Fair  Facial Expression Animated  Affect Appropriate to circumstance  Speech Logical/coherent  Interaction Assertive  Motor Activity Other (Comment) (WDL)  Appearance/Hygiene Unremarkable  Behavior Characteristics Appropriate to situation;Cooperative;Anxious;Calm  Mood Anxious;Pleasant  Thought Process  Coherency WDL  Content WDL  Delusions None reported or observed  Perception WDL  Hallucination None reported or observed  Judgment Poor  Confusion None  Danger to Self  Current suicidal ideation? Denies  Self-Injurious Behavior No self-injurious ideation or behavior indicators observed or expressed   Agreement Not to Harm Self Yes  Description of Agreement contracts for safety verbal  Danger to Others  Danger to Others None reported or observed

## 2021-07-05 NOTE — Group Note (Signed)
Recreation Therapy Group Note   Group Topic:Stress Management  Group Date: 07/05/2021 Start Time: 0935 End Time: 0952 Facilitators: Victorino Sparrow, [place] Location: [address] Day[address]   Goal Area(s) Addresses:  Patient will identify positive stress management techniques. Patient will identify benefits of using stress management post d/c.  Group Description:  Meditation.  LRT played a meditation that focused on taking good intentions and positive thoughts into your day.  Patients were to listen and follow along as the meditation played to fully engage in the activity.   Affect/Mood: Appropriate   Participation Level: Active   Participation Quality: Independent   Behavior: Appropriate   Speech/Thought Process: Focused   Insight: Good   Judgement: Good   Modes of Intervention: Meditation   Patient Response to Interventions:  Engaged   Education Outcome:  Acknowledges education and In group clarification offered    Clinical Observations/Individualized Feedback: Pt attended and participated in group activity.    Plan: Continue to engage patient in RT group sessions 2-3x/week.   Victorino Sparrow, Glennis Brink 07/05/2021 12:41 PM

## 2021-07-05 NOTE — Discharge Summary (Signed)
Physician Discharge Summary Note  Patient:  Jared Hayes is an 26 y.o., male MRN:  [id number] DOB:  07-Jun-1995 Patient phone:  [phone] (home)  Patient address:   [address] [place] [address],  Total Time spent with patient: 20 minutes  Date of Admission:  07/01/2021 Date of Discharge: 07/05/2021  Reason for Admission:  SI  Principal Problem: MDD (major depressive disorder), single episode, severe , no psychosis ([place]) Discharge Diagnoses: Principal Problem:   MDD (major depressive disorder), single episode, severe , no psychosis ([place]) Active Problems:   Adjustment disorder with depressed mood   Generalized anxiety disorder   Past Psychiatric History: None, did buy Lexapro from [email or web address] 1 day prior to [facility] presentation    Past Medical History: History reviewed. No pertinent past medical history. History reviewed. No pertinent surgical history. Family History:  Family History  Problem Relation Age of Onset   Diabetes Mother    Diabetes Father    Family Psychiatric  History:  Biological dad: Believed to be shot by mom, officially not sure if it was self-inflicted or mom. Mom: Substance use disorder. Mat grandma: Bipolar  Social History:  Social History   Substance and Sexual Activity  Alcohol Use Yes   Comment: occ     Social History   Substance and Sexual Activity  Drug Use Not Currently   Types: Marijuana    Social History   Socioeconomic History   Marital status: Single    Spouse name: Not on file   Number of children: Not on file   Years of education: Not on file   Highest education level: Not on file  Occupational History   Not on file  Tobacco Use   Smoking status: Never    Passive exposure: Never   Smokeless tobacco: Never  Vaping Use   Vaping Use: Never used  Substance and Sexual Activity   Alcohol use: Yes    Comment: occ   Drug use: Not Currently    Types: Marijuana   Sexual activity: Not on file  Other Topics  Concern   Not on file  Social History Narrative   Not on file   Social Determinants of Health   Financial Resource Strain: Not on file  Food Insecurity: Not on file  Transportation Needs: Not on file  Physical Activity: Not on file  Stress: Not on file  Social Connections: Not on file    Hospital Course:  Jared Hayes is a 26 yo w/ no previous [place] who appears to meet criteria for MDD, recurrent severe and has comorbid anxiety.  On assessment patient initially appeared very dysphoric and isolative.  Patient was continued on his Lexapro.  Patient's Lexapro was titrated upward due to patient endorsing continued depressive symptoms.  Patient began to endorse improvement in symptoms and response to Lexapro.  Patient also required use of as needed Vistaril 25 mg no more than twice daily.  Patient was also noted to use trazodone 50 mg nightly as needed, and endorsed better sleep with these medications.  Patient was not noted to have any adverse side effects these medications.  Patient became more involved on the unit, interactive in the milieu, and did well in group therapy.  Patient also was able to appropriately navigate a potentially destabilizing situation with his most recent ex-girlfriend.  Patient displayed significant improvement in his insight and judgment at the time of discharge.  By time of discharge patient had gone at least 48 hours denying SI, HI and AVH.  Patient endorses intent to remain compliant with medication.  Patient endorses learning coping skills to help him manage his thoughts and emotions and intent to continue outpatient therapy. At time of discharge patient was educated on different resources in the community should he have resurgence of SI or feel the need to reach out.  Patient endorsed understanding.  Patient also endorses feeling that he had a bigger support system that he initially thought on presentation.  Physical Findings: AIMS: Facial and Oral  Movements Muscles of Facial Expression: None, normal Lips and Perioral Area: None, normal Jaw: None, normal Tongue: None, normal,Extremity Movements Upper (arms, wrists, hands, fingers): None, normal Lower (legs, knees, ankles, toes): None, normal, Trunk Movements Neck, shoulders, hips: None, normal, Overall Severity Severity of abnormal movements (highest score from questions above): None, normal Incapacitation due to abnormal movements: None, normal Patient's awareness of abnormal movements (rate only patient's report): No Awareness, Dental Status Current problems with teeth and/or dentures?: No Does patient usually wear dentures?: No  CIWA:    COWS:     Musculoskeletal: Strength & Muscle Tone: within normal limits Gait & Station: normal Patient leans: N/A   Psychiatric Specialty Exam:  Presentation  General Appearance: Appropriate for Environment  Eye Contact:Good  Speech:Clear and Coherent  Speech Volume:Normal  Handedness:Right   Mood and Affect  Mood:Euthymic  Affect:Appropriate   Thought Process  Thought Processes:Coherent  Descriptions of Associations:Intact  Orientation:Full (Time, Place and Person)  Thought Content:Logical  History of Schizophrenia/Schizoaffective disorder:No  Duration of Psychotic Symptoms:No data recorded Hallucinations:Hallucinations: None  Ideas of Reference:None  Suicidal Thoughts:Suicidal Thoughts: No  Homicidal Thoughts:Homicidal Thoughts: No   Sensorium  Memory:Immediate Good; Recent Good; Remote Good  Judgment:Fair  Insight:Fair   Executive Functions  Concentration:Good  Attention Span:Good  [place] of Knowledge:Good  Language:Good   Psychomotor Activity  Psychomotor Activity:Psychomotor Activity: Normal   Assets  Assets:Communication Skills; Resilience; Housing   Sleep  Sleep:Sleep: Good    Physical Exam: Physical Exam HENT:     Head: Normocephalic and atraumatic.   Pulmonary:     Effort: Pulmonary effort is normal.  Neurological:     Mental Status: He is alert and oriented to person, place, and time.   Review of Systems  Respiratory:  Negative for shortness of breath.   Cardiovascular:  Negative for chest pain.  Psychiatric/Behavioral:  Negative for depression, hallucinations and suicidal ideas. The patient is not nervous/anxious and does not have insomnia.   Blood pressure 117/70, pulse 90, temperature 97.6 F (36.4 C), resp. rate 18, height 6' (1.829 m), weight 117.9 kg, SpO2 98 %. Body mass index is 35.26 kg/m.   Social History   Tobacco Use  Smoking Status Never   Passive exposure: Never  Smokeless Tobacco Never   Tobacco Cessation:  N/A, patient does not currently use tobacco products   Blood Alcohol level:  Lab Results  Component Value Date   ETH <10 [id number]    Metabolic Disorder Labs:  Lab Results  Component Value Date   HGBA1C 4.9 07/01/2021   MPG 94 07/01/2021   No results found for: PROLACTIN Lab Results  Component Value Date   CHOL 178 07/01/2021   TRIG 96 07/01/2021   HDL 32 (L) 07/01/2021   CHOLHDL 5.6 07/01/2021   VLDL 19 07/01/2021   LDLCALC 127 (H) 07/01/2021    See Psychiatric Specialty Exam and Suicide Risk Assessment completed by Attending Physician prior to discharge.  Discharge destination:  Home  Is patient on multiple antipsychotic therapies at  discharge:  No   Has Patient had three or more failed trials of antipsychotic monotherapy by history:  No  Recommended Plan for Multiple Antipsychotic Therapies: NA   Allergies as of 07/05/2021   No Known Allergies      Medication List     TAKE these medications      Indication  escitalopram 20 MG tablet Commonly known as: LEXAPRO Take 1 tablet (20 mg total) by mouth daily. Start taking on: July 06, 2021 What changed:  medication strength how much to take  Indication: Generalized Anxiety Disorder, Major Depressive Disorder    hydrOXYzine 25 MG tablet Commonly known as: ATARAX Take 1 tablet (25 mg total) by mouth 3 (three) times daily as needed for anxiety.  Indication: Feeling Anxious   traZODone 50 MG tablet Commonly known as: DESYREL Take 1 tablet (50 mg total) by mouth at bedtime as needed for sleep.  Indication: [facility]. Go on 07/22/2021.   Specialty: Behavioral Health Why: You have an appointment for therapy services on 07/22/21 at 11:00 am.   The first appointment will be held in person. Contact information: [address] [place], [place]. Go on 07/28/2021.   Why: You have an appointment for medication management services on 07/28/21 at 3:00 pm.    This appointment will be held in person (you may call to switch to Virtual) Contact information: [place] [address] [phone]         NAMI groups. Go to.   Why: Go to Website and learn about support groups and educational groups offered locally and virtually. Contact information: [email or web address]                Follow-up recommendations:  Follow up recommendations: - Activity as tolerated. - Diet as recommended by PCP. - Keep all scheduled follow-up appointments as recommended.   Comments:  Patient is instructed to take all prescribed medications as recommended. Report any side effects or adverse reactions to your outpatient psychiatrist. Patient is instructed to abstain from alcohol and illegal drugs while on prescription medications. In the event of worsening symptoms, patient is instructed to call the crisis hotline, 911, 988, go to [facility], or go to the nearest emergency department for evaluation and treatment.    Signed: PGY-2 Freida Busman, MD 07/05/2021, 5:24 PM

## 2021-07-05 NOTE — BHH Suicide Risk Assessment (Signed)
BHH INPATIENT:  Family/Significant Other Suicide Prevention Education  Suicide Prevention Education:  Education Completed; Nickolas Chalfin,  (name of family member/significant other) has been identified by the patient as the family member/significant other with whom the patient will be residing, and identified as the person(s) who will aid the patient in the event of a mental health crisis (suicidal ideations/suicide attempt).  With written consent from the patient, the family member/significant other has been provided the following suicide prevention education, prior to the and/or following the discharge of the patient.  CSW spoke with mother her reports guns have been secured and bullets removed. Mother reports no additional safety concerns.  CSW let mother know about follow up appointments and discussed education about crisis services and where to take patient in an emergency.  Mother demonstrated understanding. Mother reports she has no concerns with him returning home.   The suicide prevention education provided includes the following: Suicide risk factors Suicide prevention and interventions National Suicide Hotline telephone number [facility] assessment telephone number [facility] Emergency Assistance 911 [facility] and/or Residential Mobile Crisis Unit telephone number  Request made of family/significant other to: Remove weapons (e.g., guns, rifles, knives), all items previously/currently identified as safety concern.   Remove drugs/medications (over-the-counter, prescriptions, illicit drugs), all items previously/currently identified as a safety concern.  The family member/significant other verbalizes understanding of the suicide prevention education information provided.  The family member/significant other agrees to remove the items of safety concern listed above.  Mirranda Monrroy E Adam Demary 07/05/2021, 9:29 AM

## 2021-07-05 NOTE — Group Note (Deleted)
Date:  07/05/2021 °Time:  10:58 AM ° °Group Topic/Focus:  °Goals Group:   The focus of this group is to help patients establish daily goals to achieve during treatment and discuss how the patient can incorporate goal setting into their daily lives to aide in recovery. °Orientation:   The focus of this group is to educate the patient on the purpose and policies of crisis stabilization and provide a format to answer questions about their admission.  The group details unit policies and expectations of patients while admitted. ° ° ° ° °Participation Level:  {BHH PARTICIPATION LEVEL:22264} ° °Participation Quality:  {BHH PARTICIPATION QUALITY:22265} ° °Affect:  {BHH AFFECT:22266} ° °Cognitive:  {BHH COGNITIVE:22267} ° °Insight: {BHH Insight2:20797} ° °Engagement in Group:  {BHH ENGAGEMENT IN GROUP:22268} ° °Modes of Intervention:  {BHH MODES OF INTERVENTION:22269} ° °Additional Comments:  *** ° °Lamoyne Hessel Lashawn Coreena Rubalcava °07/05/2021, 10:58 AM ° °

## 2021-07-05 NOTE — Progress Notes (Signed)
°   07/05/21 0600  Sleep  Number of Hours 7.5

## 2021-07-20 ENCOUNTER — Telehealth (HOSPITAL_COMMUNITY): Payer: Self-pay | Admitting: Emergency Medicine

## 2021-07-20 NOTE — BH Assessment (Signed)
Care Management - Follow Up [facility] Discharges  ° °Patient has been placed in an inpatient psychiatric hospital ([facility]) on 07-01-2021  °

## 2021-07-22 ENCOUNTER — Other Ambulatory Visit: Payer: Self-pay

## 2021-07-22 ENCOUNTER — Ambulatory Visit (INDEPENDENT_AMBULATORY_CARE_PROVIDER_SITE_OTHER): Payer: 59 | Admitting: Clinical

## 2021-07-22 ENCOUNTER — Encounter (HOSPITAL_COMMUNITY): Payer: Self-pay

## 2021-07-22 DIAGNOSIS — F331 Major depressive disorder, recurrent, moderate: Secondary | ICD-10-CM

## 2021-07-22 DIAGNOSIS — F419 Anxiety disorder, unspecified: Secondary | ICD-10-CM | POA: Diagnosis not present

## 2021-07-22 NOTE — Plan of Care (Signed)
Verbal consent  

## 2021-07-22 NOTE — Progress Notes (Signed)
Virtual Visit via Video Note  I connected with Jared Hayes on 07/22/21 at 11:00 AM EST in person and verified that I am speaking with the correct person using two identifiers.  Location: Patient: Office Provider: Office     Comprehensive Clinical Assessment (CCA) Note  07/22/2021 Jared Hayes [id number]  Chief Complaint: Mood and Anxiety Visit Diagnosis: Recurrent Moderate Major Depressive Disorder with Anxiety   CCA Screening, Triage and Referral (STR)  Patient Reported Information How did you hear about [place]? Self  Referral name: No data recorded Referral phone number: No data recorded  Whom do you see for routine medical problems? No data recorded Practice/Facility Name: No data recorded Practice/Facility Phone Number: No data recorded Name of Contact: No data recorded Contact Number: No data recorded Contact Fax Number: No data recorded Prescriber Name: No data recorded Prescriber Address (if known): No data recorded  What Is the Reason for Your Visit/Call Today? Pt states, "I've been having a really tough time the last couple weeks. 2 days ago I had to put down my dog - I'd had it since I was a kid. I had a recent break-up a week to a week-and-a-half ago. One of my other dogs died unexpectedly within the last month." Pt acknowledges he's been experiencing SI and that he's experienced SI in the past. Pt denies he's ever attempted to kill himself or that he has a plan to kill himself. Pt denies HI, AVH, NSSIB, access to guns/weapons, engagement with the legal system, or SA.  How Long Has This Been Causing You Problems? 1 wk - 1 month  What Do You Feel Would Help You the Most Today? Treatment for Depression or other mood problem; Medication(s)   Have You Recently Been in Any Inpatient Treatment (Hospital/Detox/Crisis Center/28-Day Program)? No data recorded Name/Location of Program/Hospital:No data recorded How Long Were You There? No data recorded When  Were You Discharged? No data recorded  Have You Ever Received Services From [facility] Before? No data recorded Who Do You See at [facility]? No data recorded  Have You Recently Had Any Thoughts About Hurting Yourself? Yes  Are You Planning to Commit Suicide/Harm Yourself At This time? No   Have you Recently Had Thoughts About [place]? No  Explanation: No data recorded  Have You Used Any Alcohol or Drugs in the Past 24 Hours? No  How Long Ago Did You Use Drugs or Alcohol? No data recorded What Did You Use and How Much? No data recorded  Do You Currently Have a Therapist/Psychiatrist? Yes  Name of Therapist/Psychiatrist: Coolidge Breeze, therapist of [place]; has been seeing 4-6 weeks. [email or web address], medication manager; started Lexapro 10mg  06/30/2021.   Have You Been Recently Discharged From Any Office Practice or Programs? No  Explanation of Discharge From Practice/Program: No data recorded    CCA Screening Triage Referral Assessment Type of Contact: Face-to-Face  Is this Initial or Reassessment? No data recorded Date Telepsych consult ordered in [facility]:  No data recorded Time Telepsych consult ordered in [facility]:  No data recorded  Patient Reported Information Reviewed? No data recorded Patient Hayes Without Being Seen? No data recorded Reason for Not Completing Assessment: No data recorded  Collateral Involvement: Pt declines collateral at this time   Does Patient Have a Court Appointed Legal Guardian? No data recorded Name and Contact of Legal Guardian: No data recorded If Minor and Not Living with Parent(s), Who has Custody? N/A  Is CPS involved or ever been involved? Never  Is APS involved or ever been involved? Never   Patient Determined To Be At Risk for Harm To Self or Others Based on Review of Patient Reported Information or Presenting Complaint? Yes, for Self-Harm  Method: No data recorded Availability of Means: No data recorded Intent: No data  recorded Notification Required: No data recorded Additional Information for Danger to Others Potential: No data recorded Additional Comments for Danger to Others Potential: No data recorded Are There Guns or Other Weapons in Your Home? No data recorded Types of Guns/Weapons: No data recorded Are These Weapons Safely Secured?                            No data recorded Who Could Verify You Are Able To Have These Secured: No data recorded Do You Have any Outstanding Charges, Pending Court Dates, Parole/Probation? No data recorded Contacted To Inform of Risk of Harm To Self or Others: Family/Significant Other: (Pt states he's expressed his concerns to his family)   Location of Assessment: [facility]   Does Patient Present under Involuntary Commitment? No  IVC Papers Initial File Date: No data recorded  [place] of Residence: [place]   Patient Currently Receiving the Following Services: Medication Management; Individual Therapy   Determination of Need: Urgent (48 hours)   Options For Referral: Outpatient Therapy; Medication Management; [facility]     CCA Biopsychosocial Intake/Chief Complaint:  Anxiety and Depression. The patient is transitioning to OPT from Inpatient  Current Symptoms/Problems: The patient notes difficulty with mood, controlling anger, and anxiety   Patient Reported Schizophrenia/Schizoaffective Diagnosis in Past: No   Strengths: Pt is working full-time while attending school full-time. He is able to identify his thoughts, feelings, and concerns. Pt wants help for his mental health concerns.  Preferences: Watching TV, Gaming  Abilities: [employer], going to the Gym   Type of Services Patient Feels are Needed: The patient notes he has an appointment 07/28/21 to start medication management / Individual Therapy   Initial Clinical Notes/Concerns: The patient notes prior MH treatment involvement with Larene Beach through [employer].  Patient is transitioning from a recent inpatient hospitalization from 07/01/2021 to 07/05/2021 due to not being able to contract for [facility] and having S/I. The patient notes currently no S/I or H/I   Mental Health Symptoms Depression:   Change in energy/activity; Difficulty Concentrating; Hopelessness; Tearfulness; Fatigue; Weight gain/loss; Increase/decrease in appetite; Worthlessness   Duration of Depressive symptoms:  Greater than two weeks   Mania:   None   Anxiety:    Worrying; Tension; Difficulty concentrating; Fatigue; Restlessness   Psychosis:   None   Duration of Psychotic symptoms: None  Trauma:   Avoids reminders of event; Detachment from others   Obsessions:   None   Compulsions:   None   Inattention:   Disorganized; Avoids/dislikes activities that require focus   Hyperactivity/Impulsivity:   None   Oppositional/Defiant Behaviors:   None   Emotional Irregularity:   Potentially harmful impulsivity; Mood lability   Other Mood/Personality Symptoms:   None noted    Mental Status Exam Appearance and self-care  Stature:   Tall   Weight:   Overweight   Clothing:   Casual   Grooming:   Normal   Cosmetic use:   None   Posture/gait:   Normal   Motor activity:   Not Remarkable   Sensorium  Attention:   Normal   Concentration:   Normal   Orientation:  X5   Recall/memory:   Normal   Affect and Mood  Affect:   Depressed   Mood:   Depressed   Relating  Eye contact:   Normal   Facial expression:   Anxious   Attitude toward examiner:   Cooperative; Guarded   Thought and Language  Speech flow:  Clear and Coherent   Thought content:   Appropriate to Mood and Circumstances   Preoccupation:   None   Hallucinations:   None   Organization:  Landscape architect of Knowledge:   Average   Intelligence:   Average   Abstraction:   Normal   Judgement:   Normal   Reality Testing:   Realistic    Insight:   Good   Decision Making:   Only simple   Social Functioning  Social Maturity:   Isolates   Social Judgement:   Normal   Stress  Stressors:   Family conflict; Financial; School; Work; Transitions   Coping Ability:   Exhausted   Skill Deficits:   Training and development officer   Supports:   Friends/Service system     Religion: Religion/Spirituality Are You A Religious Person?: No (No) How Might This Affect Treatment?: Not assessed  Leisure/Recreation: Leisure / Recreation Do You Have Hobbies?: Yes Leisure and Hobbies: Hiking  Exercise/Diet: Exercise/Diet Do You Exercise?: No Have You Gained or Lost A Significant Amount of Weight in the Past Six Months?: Yes-Lost Number of Pounds Lost?: 50 Do You Follow a Special Diet?: No Do You Have Any Trouble Sleeping?: No   CCA Employment/Education Employment/Work Situation: Employment / Work Situation Employment Situation: Employed Where is Patient Currently Employed?: [employer] How Long has Patient Been Employed?: 5-6 months Are You Satisfied With Your Job?: No (Sees opportunity) Do You Work More Than One Job?: Yes (Is also in school full-time) Work Stressors: Pt states he attends school full-time while working full-time Patient's Job has Been Impacted by Current Illness: Yes Describe how Patient's Job has Been Impacted: Pt has missed 3 days of work this week due to anxiety What is the Longest Time Patient has Held a Job?: 1-1/2 years Where was the Patient Employed at that Time?: [employer] Has Patient ever Been in the [employer]?: No  Education: Education Is Patient Currently Attending School?: No Last Grade Completed: 12 (Pt is currently working to obtain his [employer]) Name of [place]: [place] Did Teacher, adult education From [employer]?: Yes Did Physicist, medical?: Yes What Type of College Degree Do you Have?: Pt is currently working to earn his BA Did Heritage manager?: No What Was  Your Major?: NA Did You Have Any Special Interests In School?: NA Did You Have An Individualized Education Program (IIEP): No Did You Have Any Difficulty At [employer]?: No Patient's Education Has Been Impacted by Current Illness: No   CCA Family/Childhood History Family and Relationship History: Family history Marital status: Single Are you sexually active?: No What is your sexual orientation?: Heterosexual Has your sexual activity been affected by drugs, alcohol, medication, or emotional stress?: NA Does patient have children?: No  Childhood History:  Childhood History By whom was/is the patient raised?: Both parents, Grandparents Additional childhood history information: Adopted at age 52-4yo, does not know about his birth parents. Description of patient's relationship with caregiver when they were a child: Good with both, although they would joke around and call him "stupid." Patient's description of current relationship with people who raised him/her: Father - "okay"; Mother -  dismissive of his mental health concerns. How were you disciplined when you got in trouble as a child/adolescent?: Grounded or spanked Does patient have siblings?: Yes Number of Siblings: 2 Description of patient's current relationship with siblings: Sister - no contact because of choices she has made; Brother - talks to him about once a month Did patient suffer any verbal/emotional/physical/sexual abuse as a child?: No (Does not know about anything that happened prior to age 55-4yo when he was adopted.  Does not know why he was put up for adoption.) Did patient suffer from severe childhood neglect?: No Has patient ever been sexually abused/assaulted/raped as an adolescent or adult?: No Was the patient ever a victim of a crime or a disaster?: No Witnessed domestic violence?: No Has patient been affected by domestic violence as an adult?: No  Child/Adolescent Assessment:     CCA Substance Use Alcohol/Drug  Use: Alcohol / Drug Use Pain Medications: See MAR Prescriptions: See MAR Over the Counter: See MAR History of alcohol / drug use?: No history of alcohol / drug abuse Longest period of sobriety (when/how long): N/A Negative Consequences of Use:  (N/A) Withdrawal Symptoms:  (N/A)                         ASAM's:  Six Dimensions of Multidimensional Assessment  Dimension 1:  Acute Intoxication and/or Withdrawal Potential:      Dimension 2:  Biomedical Conditions and Complications:      Dimension 3:  Emotional, Behavioral, or Cognitive Conditions and Complications:     Dimension 4:  Readiness to Change:     Dimension 5:  Relapse, Continued use, or Continued Problem Potential:     Dimension 6:  Recovery/Living Environment:     ASAM Severity Score:    ASAM Recommended Level of Treatment: ASAM Recommended Level of Treatment:  (N/A)   Substance use Disorder (SUD) Substance Use Disorder (SUD)  Checklist Symptoms of Substance Use:  (N/A)  Recommendations for Services/Supports/Treatments: Recommendations for Services/Supports/Treatments Recommendations For Services/Supports/Treatments: Individual Therapy, Medication Management (Continuous Assessment at [facility])  DSM5 Diagnoses: Patient Active Problem List   Diagnosis Date Noted   Adjustment disorder with depressed mood 07/01/2021   MDD (major depressive disorder), single episode, severe , no psychosis (HCC) 07/01/2021   Generalized anxiety disorder 07/01/2021    Patient Centered Plan: Patient is on the following Treatment Plan(s):  Recurrent Moderate Major Depressive Disorder   Referrals to Alternative Service(s): Referred to Alternative Service(s):   Place:   Date:   Time:    Referred to Alternative Service(s):   Place:   Date:   Time:    Referred to Alternative Service(s):   Place:   Date:   Time:    Referred to Alternative Service(s):   Place:   Date:   Time:      Collaboration of Care: No collaboration of care  connected to this visit  Patient/Guardian was advised Release of Information must be obtained prior to any record release in order to collaborate their care with an outside provider. Patient/Guardian was advised if they have not already done so to contact the registration department to sign all necessary forms in order for [place] to release information regarding their care.   Consent: Patient/Guardian gives verbal consent for treatment and assignment of benefits for services provided during this visit. Patient/Guardian expressed understanding and agreed to proceed.   I discussed the assessment and treatment plan with the patient. The patient was provided an opportunity  to ask questions and all were answered. The patient agreed with the plan and demonstrated an understanding of the instructions.   The patient was advised to call back or seek an in-person evaluation if the symptoms worsen or if the condition fails to improve as anticipated.  I provided 60 minutes of face-to-face time during this encounter.   Lennox Grumbles, LCSW  07/22/2021

## 2021-08-11 ENCOUNTER — Ambulatory Visit: Payer: Self-pay | Admitting: Registered Nurse

## 2021-08-19 ENCOUNTER — Ambulatory Visit (HOSPITAL_COMMUNITY): Payer: 59 | Admitting: Clinical

## 2021-08-19 ENCOUNTER — Other Ambulatory Visit: Payer: Self-pay

## 2021-08-31 ENCOUNTER — Other Ambulatory Visit: Payer: Self-pay

## 2021-08-31 ENCOUNTER — Telehealth (HOSPITAL_COMMUNITY): Payer: Self-pay | Admitting: Clinical

## 2021-08-31 ENCOUNTER — Ambulatory Visit (HOSPITAL_COMMUNITY): Payer: Self-pay | Admitting: Clinical

## 2021-08-31 NOTE — Telephone Encounter (Signed)
The patient did not respond to contact attempts and has no VM box set up ?

## 2021-11-30 IMAGING — CT CT HEAD W/O CM
3 series · 14 of 46 positions shown, 16 images · non-contrast
Comparison: 08/08/2009 head CT.

CLINICAL DATA: Head injury.

EXAM:
CT HEAD WITHOUT CONTRAST
TECHNIQUE: Contiguous axial images were obtained from the base of the skull
through the vertex without intravenous contrast.

[Series 2: head wo · axial · 0.45mm/px · z∈[-175,-55]mm · 8 of 29 slices shown, 10 images]
[im 3/29  brain]
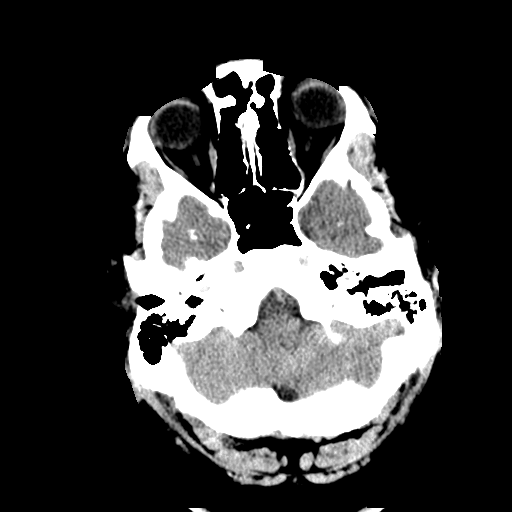
[im 3/29  bone]
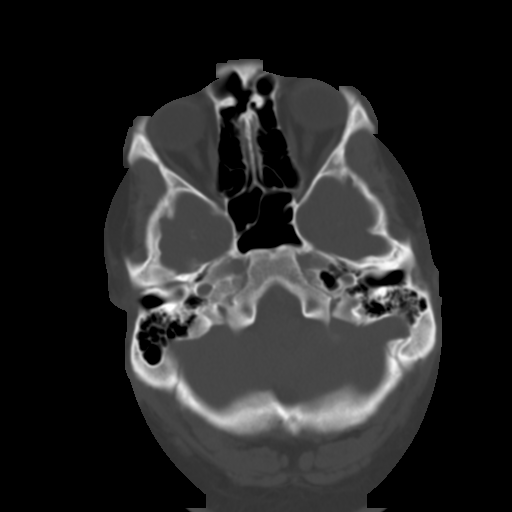
[im 7/29  brain]
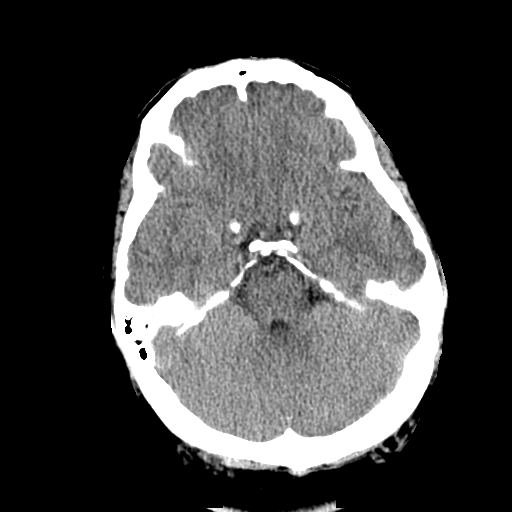
[im 10/29  brain]
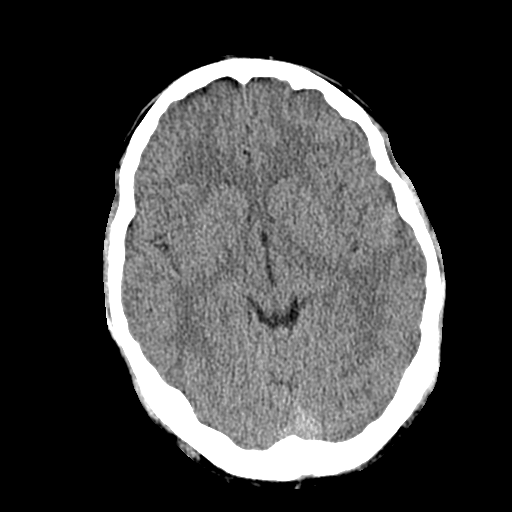
[im 13/29  brain]
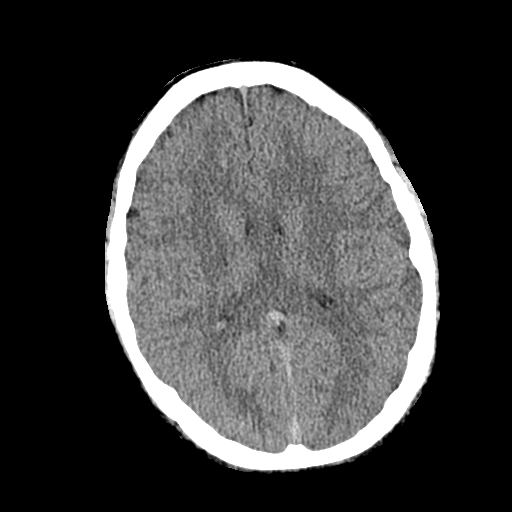
[im 17/29  brain]
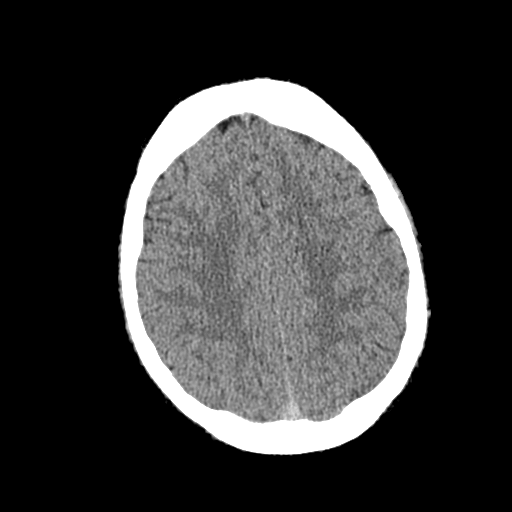
[im 17/29  bone]
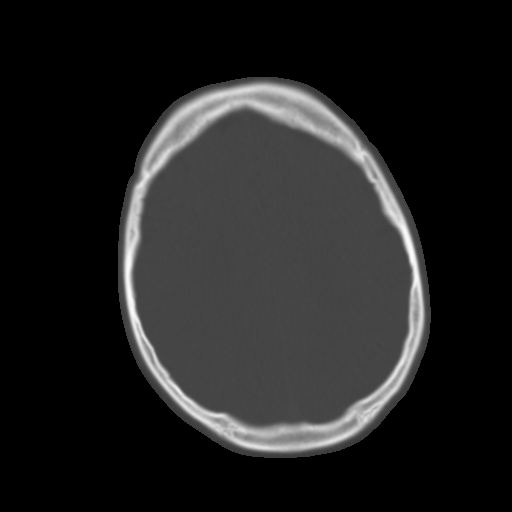
[im 20/29  brain]
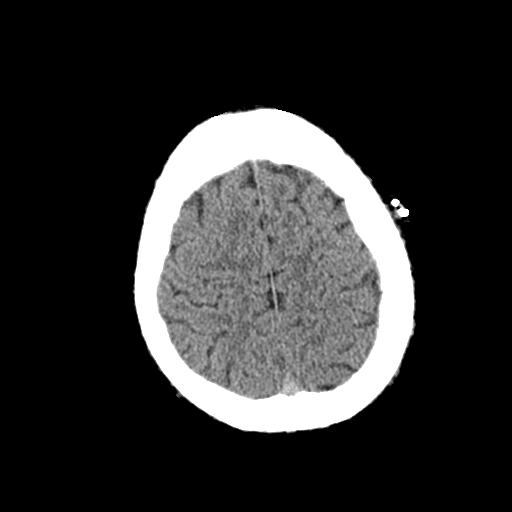
[im 23/29  brain]
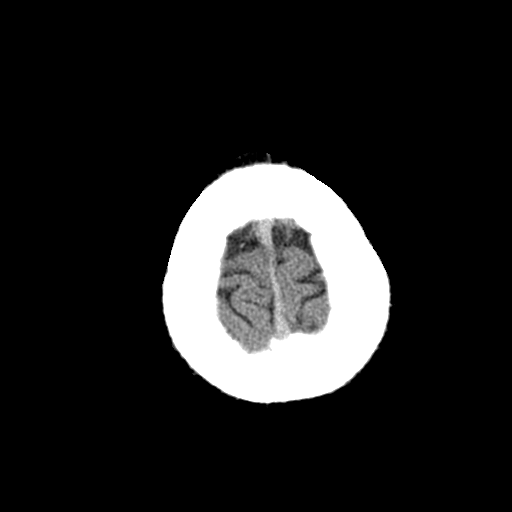
[im 27/29  brain]
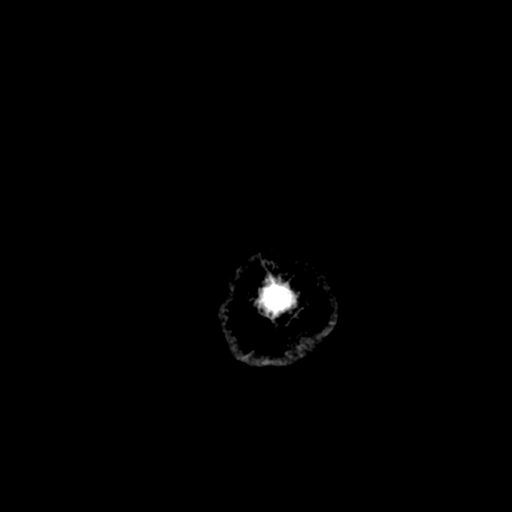

[Series 4: cor soft · coronal · 0.28mm/px · 3 of 69 slices shown]
[im 23/69  brain]
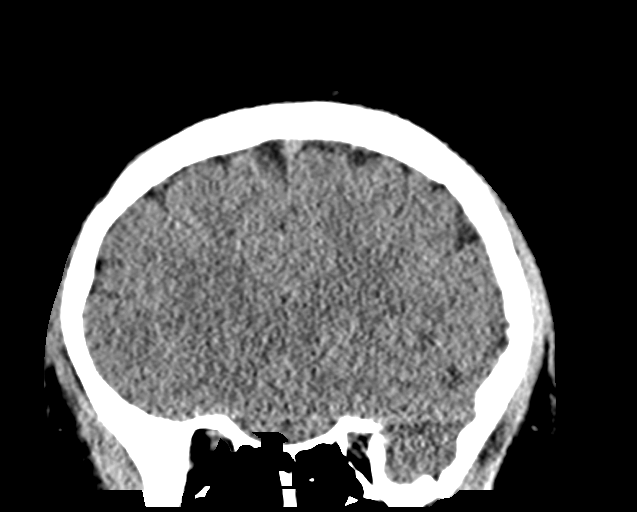
[im 31/69  brain]
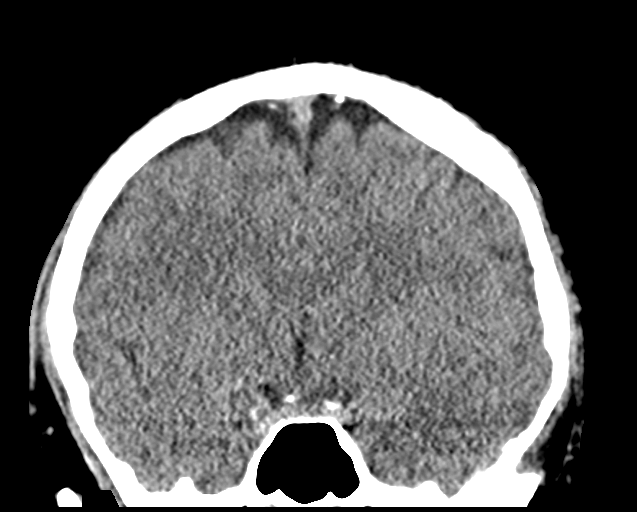
[im 38/69  brain]
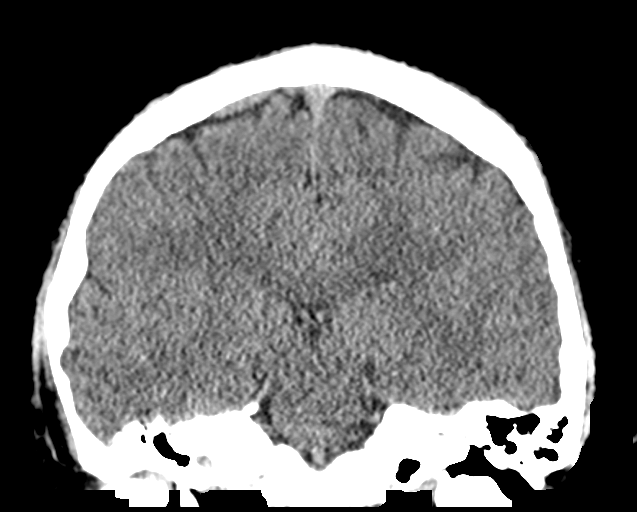

[Series 5: sag soft · sagittal · 0.30mm/px · 3 of 59 slices shown]
[im 20/59  brain]
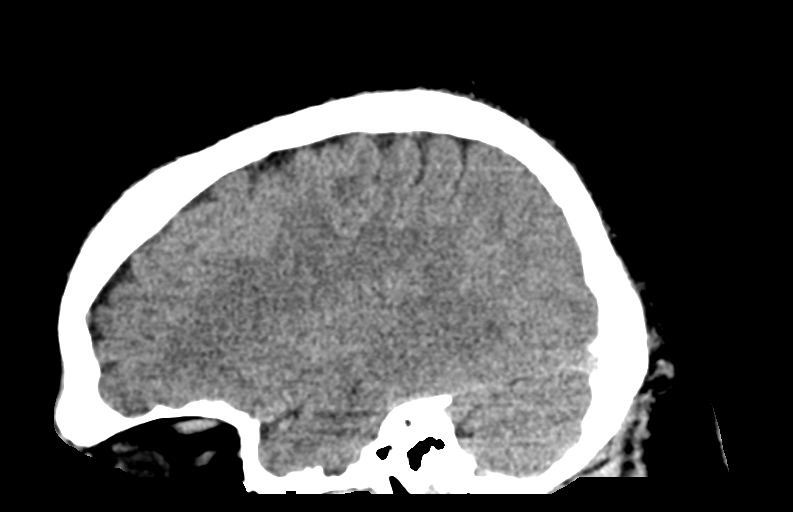
[im 30/59  brain]
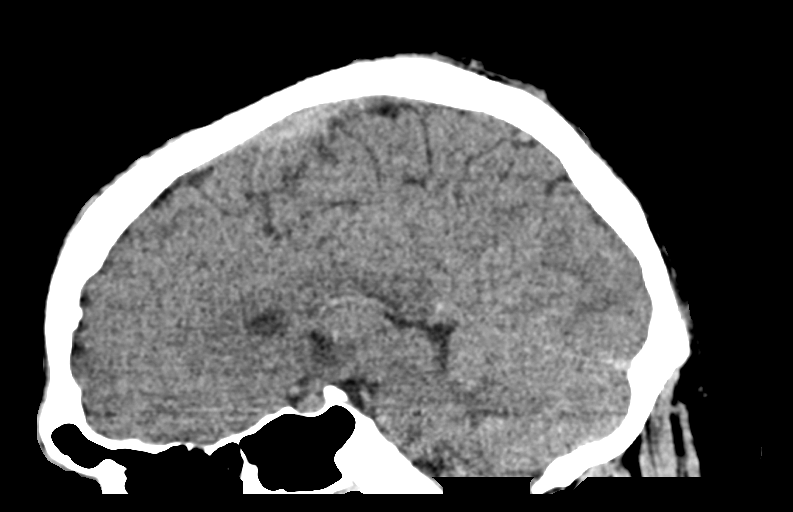
[im 39/59  brain]
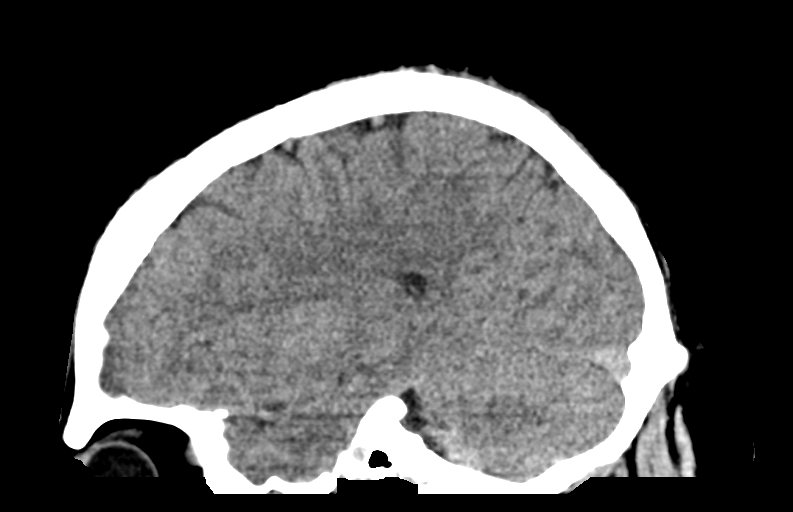

[14 of 46 positions shown; findings below may reference images not displayed]

FINDINGS: Brain: No acute infarct or intracranial hemorrhage. No mass lesion.
No midline shift, ventriculomegaly or extra-axial fluid collection.

Vascular: No hyperdense vessel or unexpected calcification.

Skull: Negative for fracture or focal lesion.

Sinuses/Orbits: Normal orbits. Clear paranasal sinuses. No mastoid
effusion.

Other: Left frontal scalp staples.
IMPRESSION: No acute intracranial process.

Sequela of left frontal scalp laceration repair.

## 2022-03-24 ENCOUNTER — Encounter (HOSPITAL_COMMUNITY): Payer: Self-pay

## 2022-03-24 ENCOUNTER — Other Ambulatory Visit: Payer: Self-pay

## 2022-03-24 ENCOUNTER — Emergency Department (HOSPITAL_COMMUNITY)
Admission: EM | Admit: 2022-03-24 | Discharge: 2022-03-24 | Disposition: A | Discharge status: Home / Self Care | Payer: Self-pay | Attending: Emergency Medicine | Admitting: Emergency Medicine

## 2022-03-24 DIAGNOSIS — B349 Viral infection, unspecified: Secondary | ICD-10-CM | POA: Insufficient documentation

## 2022-03-24 DIAGNOSIS — J069 Acute upper respiratory infection, unspecified: Secondary | ICD-10-CM | POA: Insufficient documentation

## 2022-03-24 MED ORDER — BENZONATATE 100 MG PO CAPS
100.0000 mg | ORAL_CAPSULE | Freq: Three times a day (TID) | ORAL | 0 refills | Status: AC
Start: 2022-03-24 — End: ?

## 2022-03-24 MED ORDER — ACETAMINOPHEN 500 MG PO TABS: 500.0000 mg | ORAL_TABLET | Freq: Four times a day (QID) | ORAL | 0 refills | Status: AC | PRN

## 2022-03-24 NOTE — ED Provider Notes (Signed)
[facility] EMERGENCY DEPARTMENT Provider Note   CSN: [id number] Arrival date & time: 03/24/22  4259     History  Chief Complaint  Patient presents with   URI    Jared Hayes is a 26 y.o. male.  The history is provided by the patient and medical records. No language interpreter was used.  URI    26 year old male presenting with cold symptoms.  For the past week patient has had sinus congestion, throat irritation, nonproductive cough, and now having some body aches.  Symptom has been waxing waning and he has been using over-the-counter medication at home without adequate relief.  He does not endorse any significant shortness of breath no nausea vomiting diarrhea no hemoptysis.  He requested for Dr.'s note.  Home Medications Prior to Admission medications   Medication Sig Start Date End Date Taking? Authorizing Provider  escitalopram (LEXAPRO) 20 MG tablet Take 1 tablet (20 mg total) by mouth daily. 07/06/21   Freida Busman, MD  hydrOXYzine (ATARAX) 25 MG tablet Take 1 tablet (25 mg total) by mouth 3 (three) times daily as needed for anxiety. 07/05/21   Freida Busman, MD  traZODone (DESYREL) 50 MG tablet Take 1 tablet (50 mg total) by mouth at bedtime as needed for sleep. 07/05/21   Freida Busman, MD      Allergies    Patient has no known allergies.    Review of Systems   Review of Systems  All other systems reviewed and are negative.   Physical Exam Updated Vital Signs BP 137/75 (BP Location: Right Arm)   Pulse (!) 102   Temp 99.1 F (37.3 C) (Oral)   Resp 18   Ht 6' (1.829 m)   Wt 117.9 kg   SpO2 98%   BMI 35.26 kg/m  Physical Exam Vitals and nursing note reviewed.  Constitutional:      General: He is not in acute distress.    Appearance: He is well-developed.  HENT:     Head: Atraumatic.     Right Ear: Tympanic membrane normal.     Left Ear: Tympanic membrane normal.     Nose: Nose normal.     Mouth/Throat:     Mouth: Mucous  membranes are moist.  Eyes:     Conjunctiva/sclera: Conjunctivae normal.  Cardiovascular:     Rate and Rhythm: Normal rate and regular rhythm.     Pulses: Normal pulses.     Heart sounds: Normal heart sounds.  Pulmonary:     Effort: Pulmonary effort is normal.     Breath sounds: Normal breath sounds. No wheezing, rhonchi or rales.  Musculoskeletal:     Cervical back: Neck supple.  Skin:    Findings: No rash.  Neurological:     Mental Status: He is alert.     ED Results / Procedures / Treatments   Labs (all labs ordered are listed, but only abnormal results are displayed) Labs Reviewed - No data to display  EKG None  Radiology No results found.  Procedures Procedures    Medications Ordered in ED Medications - No data to display  ED Course/ Medical Decision Making/ A&P                           Medical Decision Making  BP 137/75 (BP Location: Right Arm)   Pulse (!) 102   Temp 99.1 F (37.3 C) (Oral)   Resp 18   Ht 6' (  1.829 m)   Wt 117.9 kg   SpO2 98%   BMI 35.26 kg/m   36:66 AM   26 year old male presenting with cold symptoms.  For the past week patient has had sinus congestion, throat irritation, nonproductive cough, and now having some body aches.  Symptom has been waxing waning and he has been using over-the-counter medication at home without adequate relief.  He does not endorse any significant shortness of breath no nausea vomiting diarrhea no hemoptysis.  He requested for Dr.'s note.  27 y.o. male with presentation consistent with acute viral upper respiratory tract infection.   As patient does not present w/ any concrete signs/symptoms of pneumonia or other complications, deferred CXR or further labwork at this time.  No evidence of bacterial infections including pneumonia, meningitis, pharyngitis. Parents advised to continue ibuprofen and Tylenol at home. Patient is to followup with primary physician if having continued symptoms. Patient were advised to  return to the ER if concern for alteration in mental status, uncontrolled fever, dehydration, or other concerns.  Impression: Viral Upper Respiratory infection   Plan:    * Discharge from ED   * Advised Pt on supportive therapies, including OTC acetaminophen or ibuprofen for fever and body aches, bed rest while significantly symptomatic, advancing clear fluids as tolerated (8-10cups), and thorough handwashing.   * Advised Pt to return to school/work only after resolution of fever, abstain from exercise and contact sports until symptoms have improved, refrain from sharing cups/utensils/toothbrushes/straws/lip gloss/etc while potentially infectious..   * Advised Pt to monitor for altered mental status, worsening fever, or respiratory distress. Instructed Pt to f/up w/ PCP or ER should symptoms worsen or not improve. Pt verbally expressed understanding and all questions were addressed to Pt's satisfaction.         Final Clinical Impression(s) / ED Diagnoses Final diagnoses:  Viral syndrome    Rx / DC Orders ED Discharge Orders          Ordered    benzonatate (TESSALON) 100 MG capsule  Every 8 hours        03/24/22 0722    acetaminophen (TYLENOL) 500 MG tablet  Every 6 hours PRN        03/24/22 0722              Fayrene Helper, PA-C 03/24/22 0723    Long, Arlyss Repress, MD 03/24/22 1130

## 2022-03-24 NOTE — ED Triage Notes (Signed)
Runny nose and generalized body aches. Request eval to r/o covid and doctors note.

## 2022-06-08 ENCOUNTER — Ambulatory Visit
Admission: RE | Admit: 2022-06-08 | Discharge: 2022-06-08 | Disposition: A | Discharge status: Home / Self Care | Payer: Worker's Compensation | Source: Ambulatory Visit | Attending: Family Medicine | Admitting: Family Medicine

## 2022-06-08 ENCOUNTER — Other Ambulatory Visit: Payer: Self-pay | Admitting: Family Medicine

## 2022-06-08 DIAGNOSIS — M25571 Pain in right ankle and joints of right foot: Secondary | ICD-10-CM

## 2022-06-15 ENCOUNTER — Ambulatory Visit: Payer: Self-pay | Admitting: Podiatry

## 2022-06-28 ENCOUNTER — Ambulatory Visit: Admitting: Podiatry
